# Patient Record
Sex: Female | Born: 1945 | Race: White | Hispanic: No | Marital: Married | Smoking: Never smoker
Health system: Southern US, Academic
[De-identification: ages and names within clinical notes are randomized; demographics above are authoritative.]

## PROBLEM LIST (undated history)

## (undated) DIAGNOSIS — J45909 Unspecified asthma, uncomplicated: Secondary | ICD-10-CM

## (undated) DIAGNOSIS — F411 Generalized anxiety disorder: Secondary | ICD-10-CM

## (undated) DIAGNOSIS — E079 Disorder of thyroid, unspecified: Secondary | ICD-10-CM

## (undated) DIAGNOSIS — D691 Qualitative platelet defects: Secondary | ICD-10-CM

## (undated) DIAGNOSIS — K219 Gastro-esophageal reflux disease without esophagitis: Secondary | ICD-10-CM

## (undated) DIAGNOSIS — I10 Essential (primary) hypertension: Secondary | ICD-10-CM

## (undated) DIAGNOSIS — E782 Mixed hyperlipidemia: Secondary | ICD-10-CM

## (undated) HISTORY — PX: HX NO SURGICAL PROCEDURES: 2100001501

---

## 1976-05-22 ENCOUNTER — Inpatient Hospital Stay (HOSPITAL_COMMUNITY): Payer: Self-pay

## 2009-02-07 IMAGING — MG MAMMO SCREEN W CAD
1 series · 4 of 4 positions shown · non-contrast
Comparison: none

EXAM:
BILATERAL DIGITAL SCREENING MAMMOGRAM WITH CAD
HISTORY: Screening.
TECHNIQUE: Digital screening mammography was performed with CAD.

[Series 2: R CC · right · 4 of 4 slices shown]
[im 1/4]
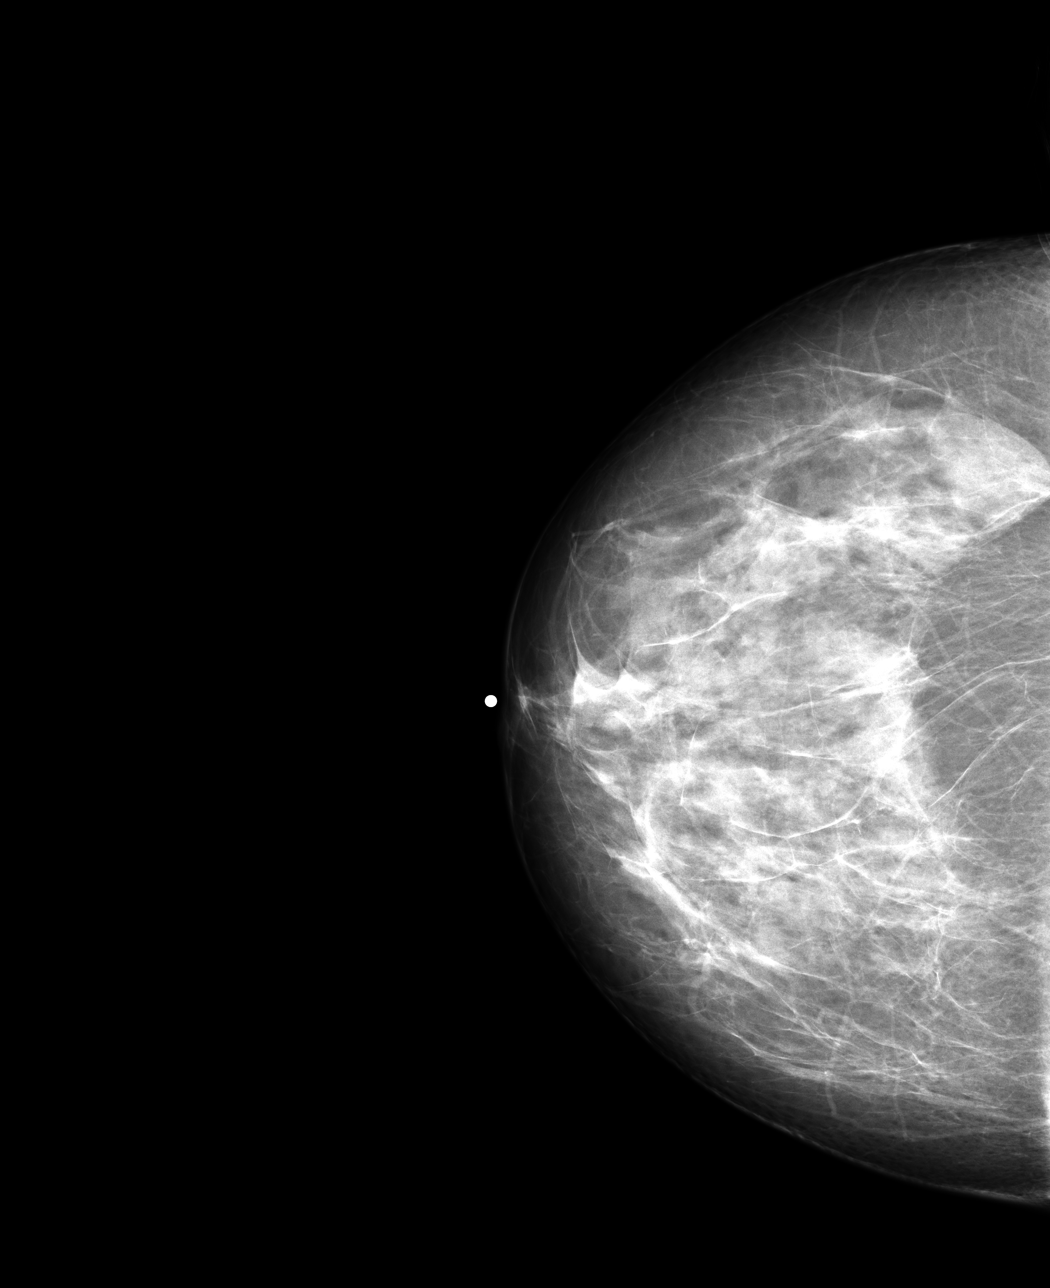
[im 2/4]
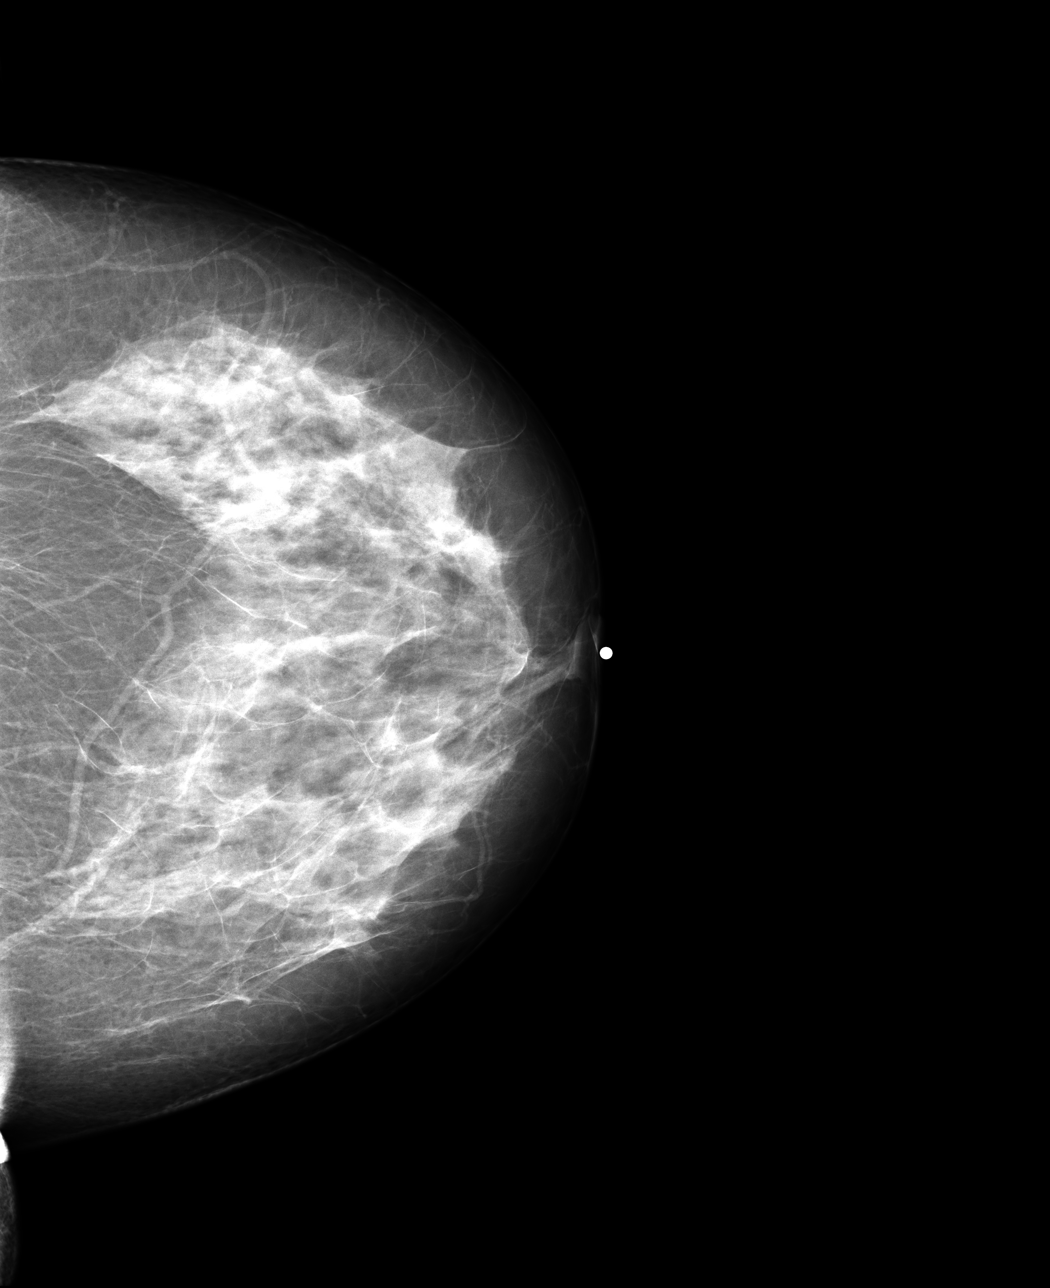
[im 3/4]
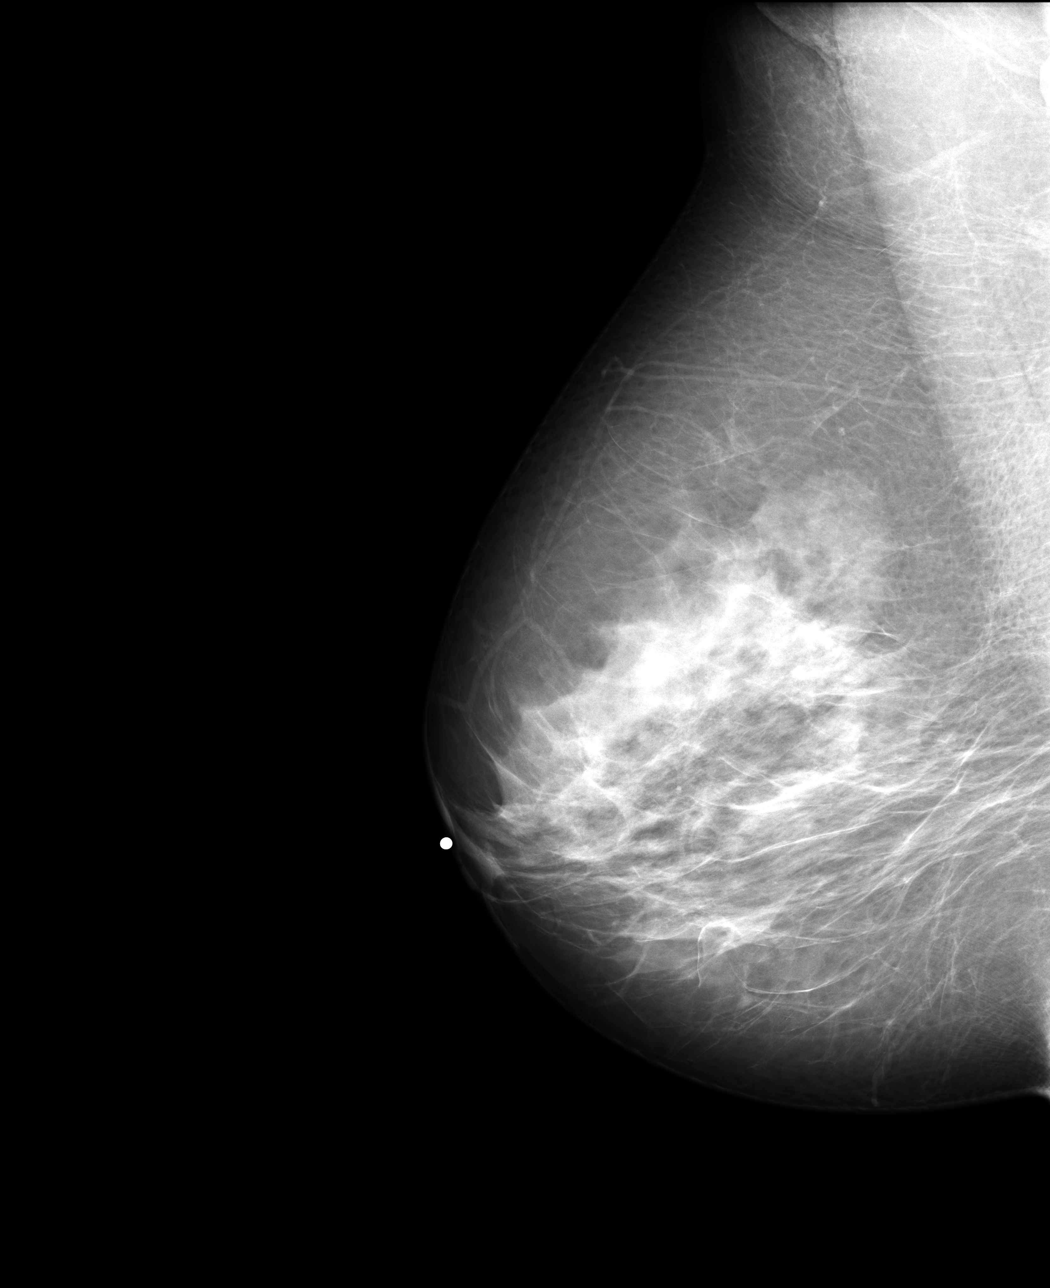
[im 4/4]
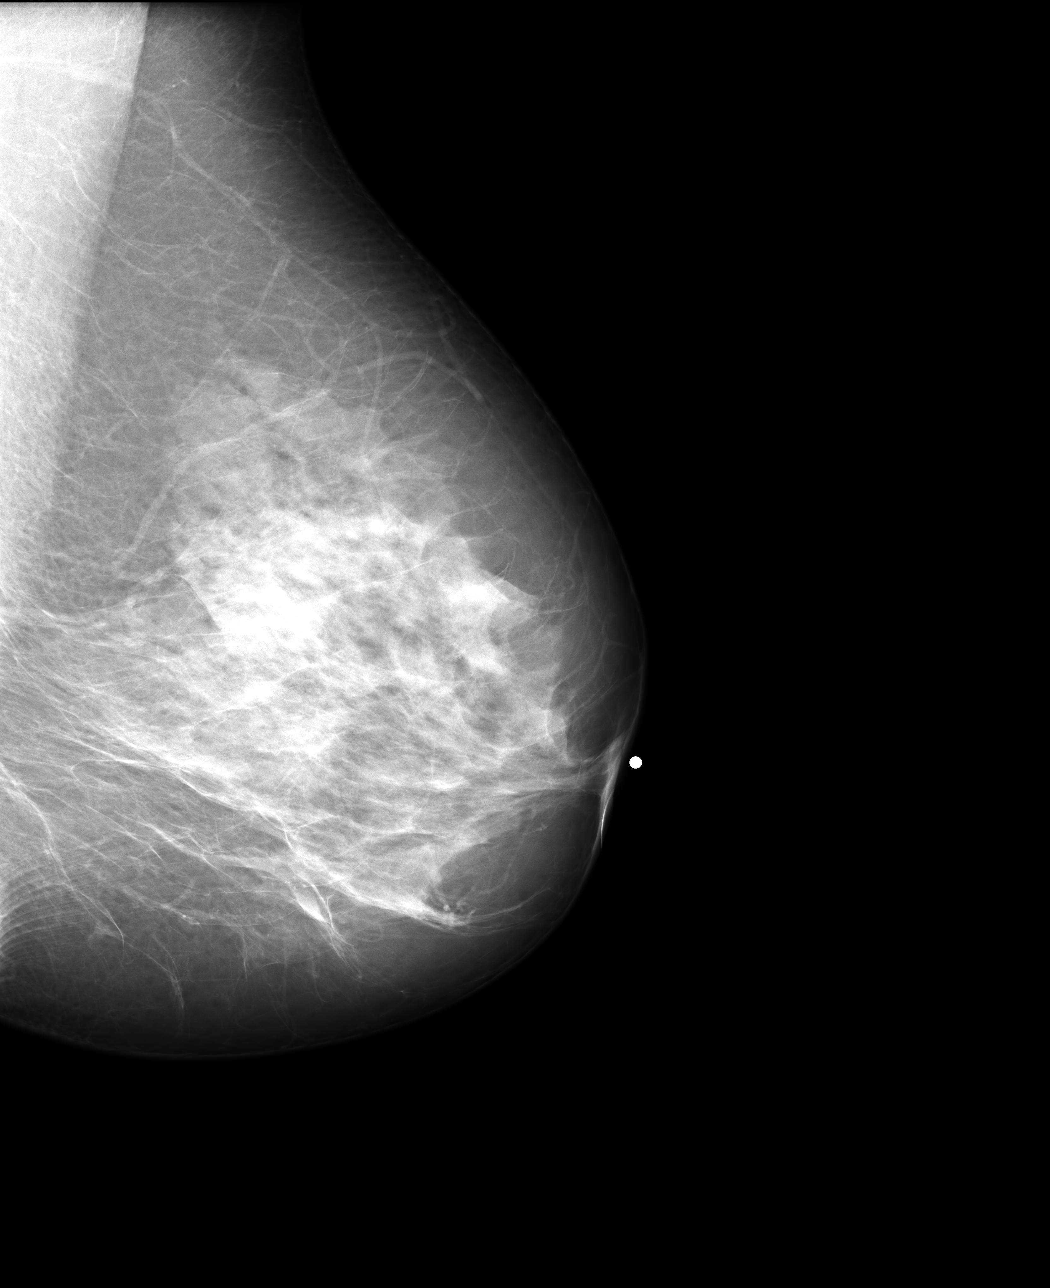

[4 of 4 positions shown; findings below may reference images not displayed]

FINDINGS: There is a moderate amount of parenchyma with a benign appearance in a symmetrical distribution.  No malignant-appearing masses, spiculated lesions, or suspicious microcalcifications are seen.  No significant change is evident compared to prior films dated April 13, 2006.

NOTE:

In compliance with Federal regulations, the results of this mammogram are being sent to the patient.
IMPRESSION: No radiographic evidence of malignancy.

Final Assessment Code:  BI-RADS 1-Negative

BI-RADS 0
Need additional imaging evaluation
BI-RADS 1
Negative mammogram
BI-RADS 2
Benign finding
BI-RADS 3
Probably benign finding - short interval follow-up suggested
BI-RADS 4
Suspicious abnormality: biopsy should be considered
BI-RADS 5
Highly suggestive of malignancy; appropriate action should be taken

________________________________

## 2010-05-29 IMAGING — MG MAMMO SCREEN W CAD
1 series · 5 of 5 positions shown · non-contrast
Comparison: 02/07/09 and 04/13/06.

Exam:
Bilateral digital screening mammogram with CAD
HISTORY: Asymptomatic 65 year old with family history of breast cancer in her mother.

[Series 2: R CC · right · 5 of 5 slices shown]
[im 1/5]
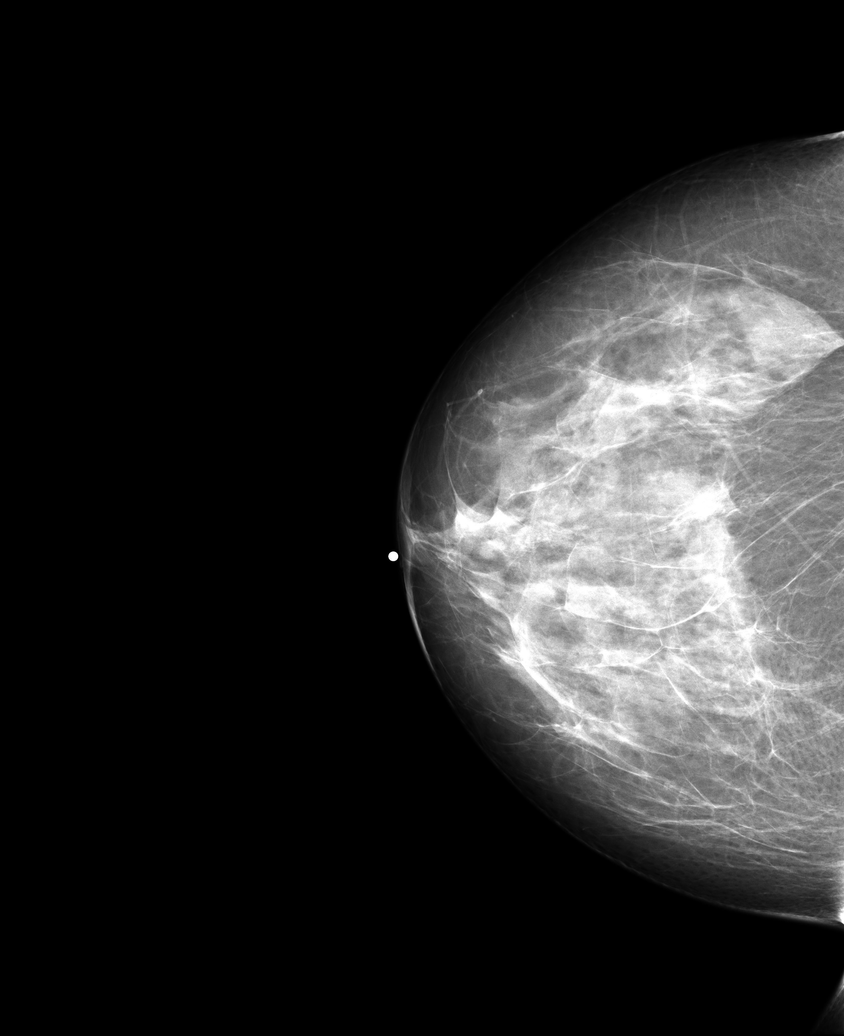
[im 2/5]
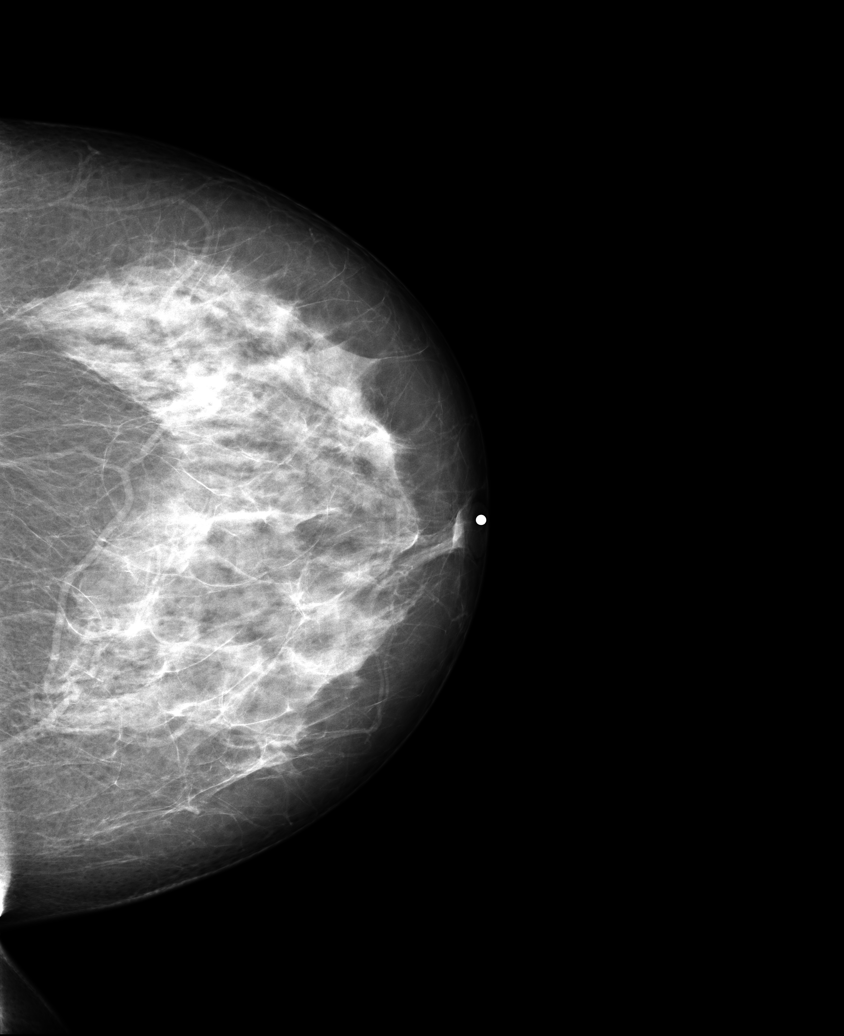
[im 3/5]
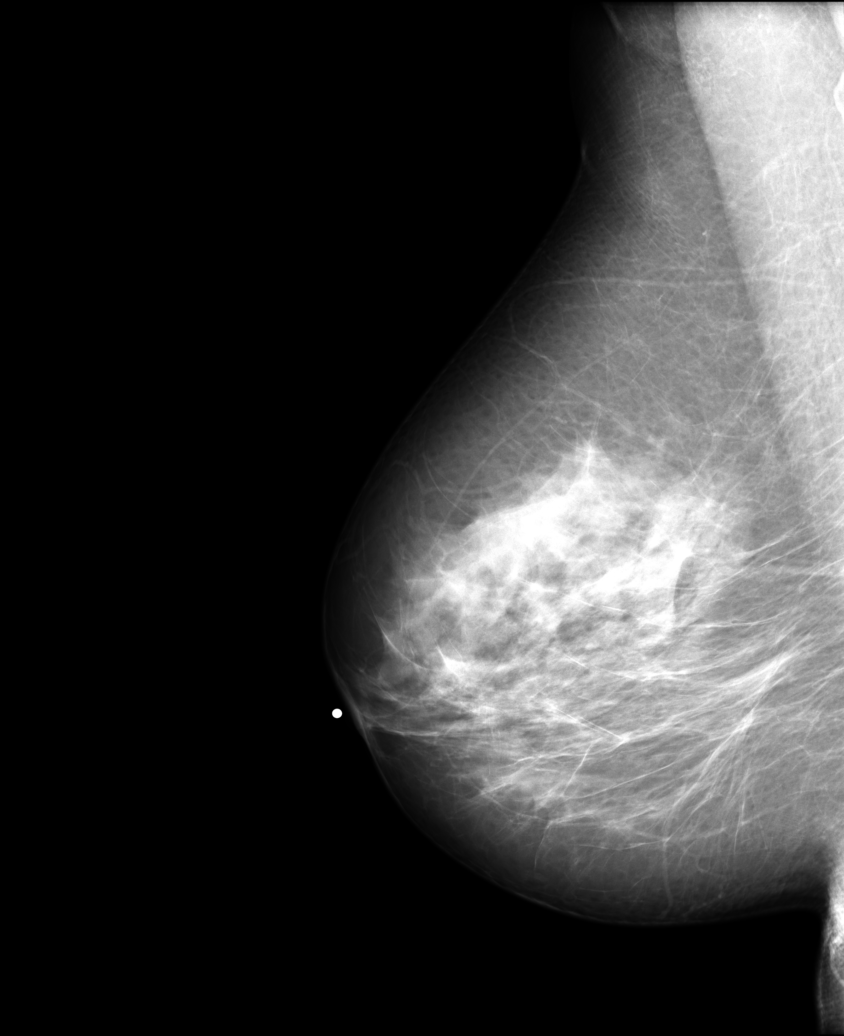
[im 4/5]
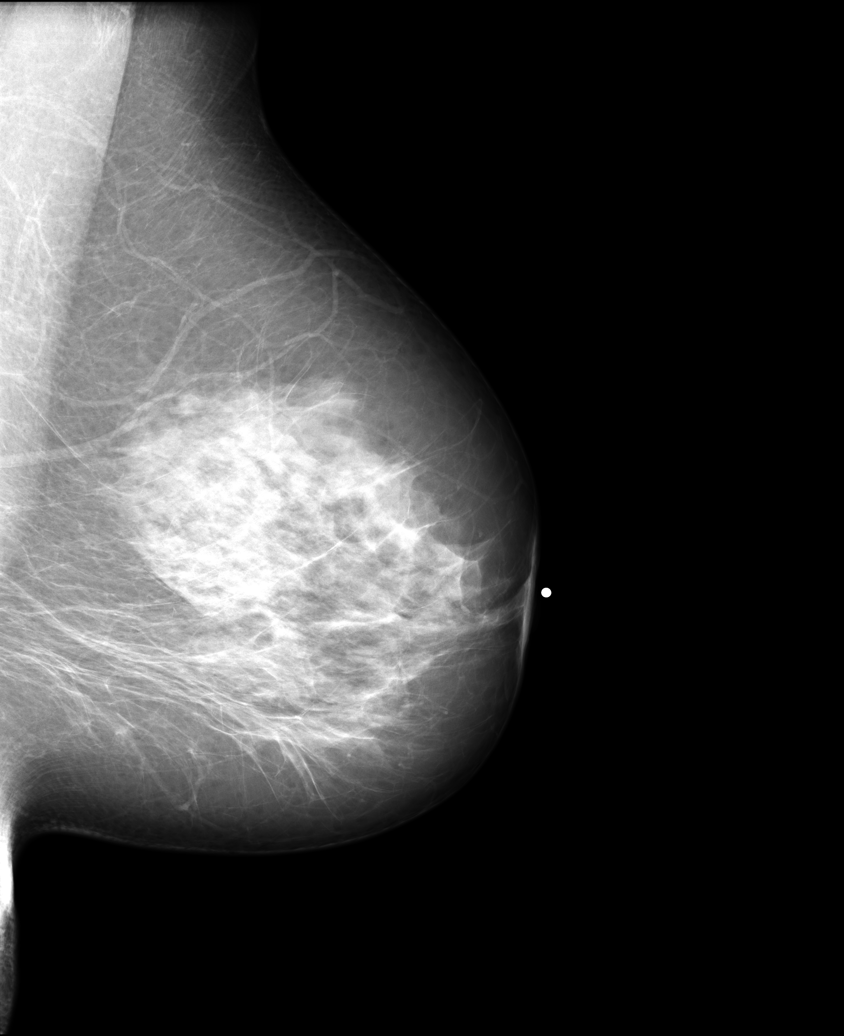
[im 5/5]
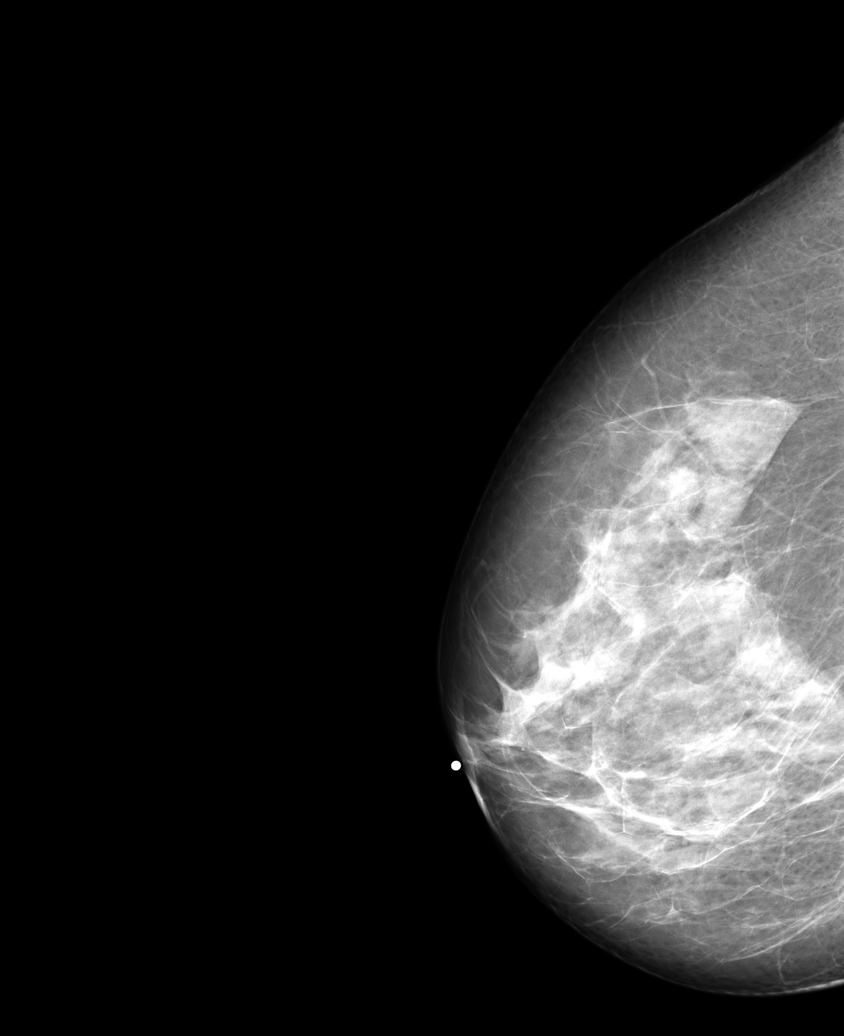

[5 of 5 positions shown; findings below may reference images not displayed]

FINDINGS: Dense breast tissue limits sensitivity to mammogram in this patient. No masses or architectural changes are seen. No evidence of abnormal calcific density, skin changes or nipple changes is seen. 

NOTE:

In compliance with Federal regulations, the results of this mammogram are being sent to the patient.
IMPRESSION: Bilaterally dense breasts limit sensitivity to mammogram. Mammographic findings however are stable. Please correlate with clinical breast examination findings. 
Clinical follow up and mammographic follow up are recommended at twelve months. 
Final Assessment Code:
Bi-Rads 2 

BI-RADS 0
Need additional imaging evaluation
BI-RADS 1
Negative mammogram
BI-RADS 2
Benign finding
BI-RADS 3
Probably benign finding: short-interval follow-up suggested
BI-RADS 4
Suspicious abnormality:  biopsy should be considered
BI-RADS 5
Highly suggestive of malignancy; appropriate action should be taken

________________________________
Jari-Juhani Enlund., signed this document electronically

## 2010-05-29 IMAGING — CR CXR
1 series · 2 of 2 positions shown · non-contrast
Comparison: None available.

Exam:

PA and Lateral Chest 2V
HISTORY: Hypertension, asthma.

[Series 1: view not recorded · 0.17mm/px · 2 of 2 slices shown]
[im 1/2]
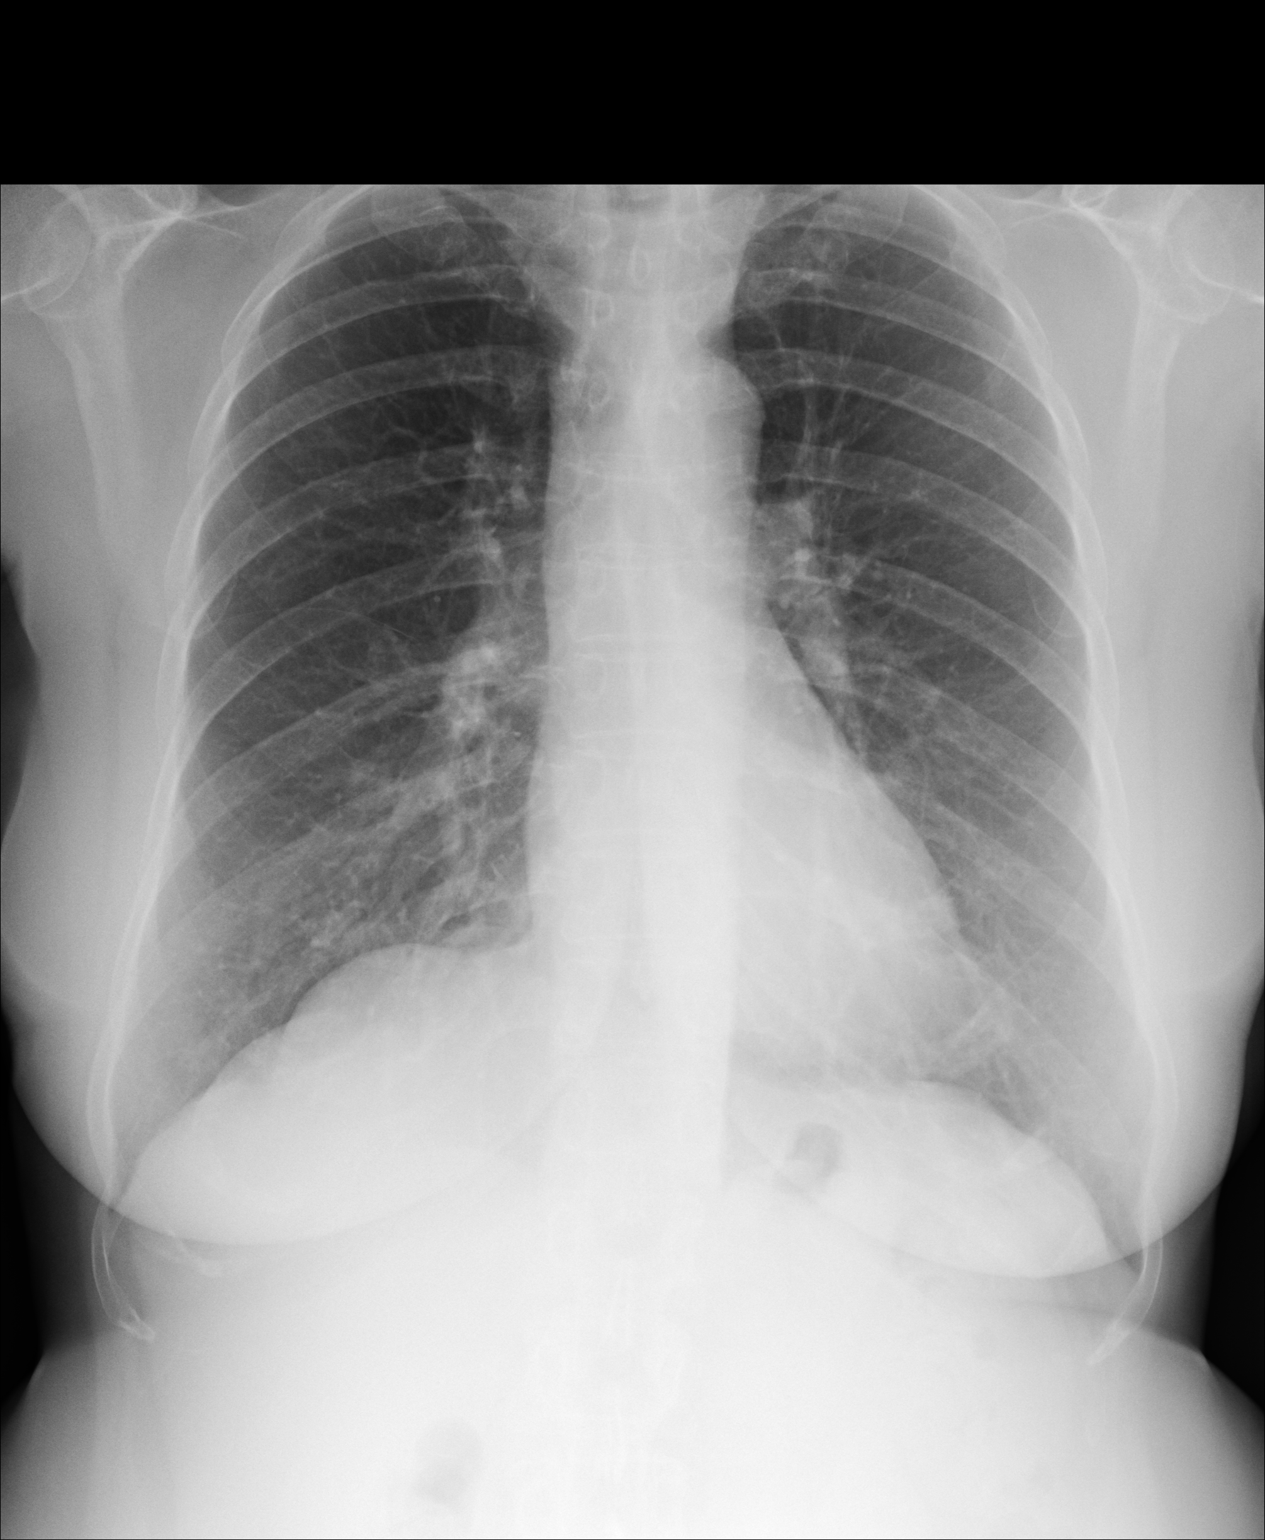
[im 2/2]
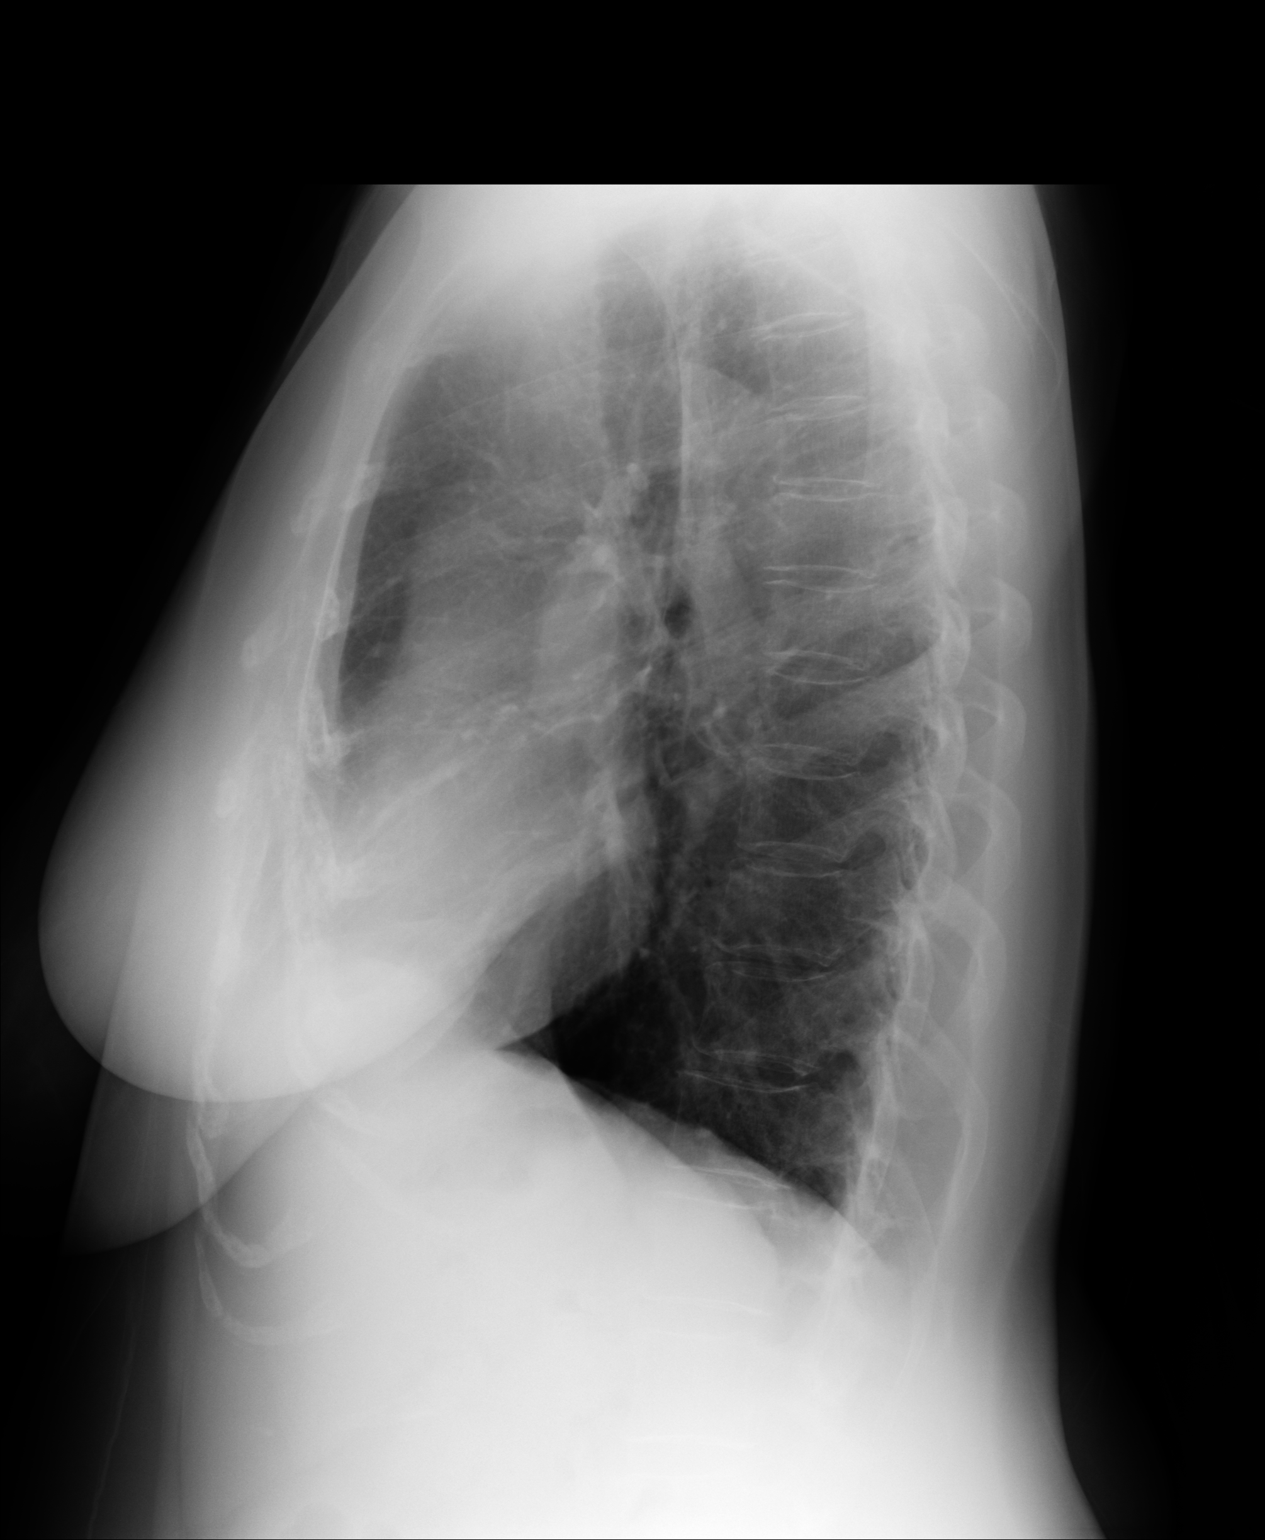

[2 of 2 positions shown; findings below may reference images not displayed]

FINDINGS: Cardiothoracic ratio is normal. Lungs do not show any acute lesions. Mild hyper expansion of the lungs is noted. Hila and mediastinum are normal.
IMPRESSION: No significant cardiopulmonary abnormalities are seen other than mild hyper expansion of the lungs. 

________________________________

[DATE] [DATE]

## 2011-06-01 IMAGING — MG MAMMO SCREEN W CAD
1 series · 4 of 4 positions shown · non-contrast
Comparison: 05/29/10 and 02/07/09.

INDICATION: Screening
ADDITIONAL HISTORY:
Patient denies previous breast surgeries. She denies any current problems with her breasts. 

RISK FACTOR:
Positive family history of breast cancer (mother).
TECHNIQUE: Bilateral MLO and CC digital images were obtained. This study was reviewed using iCAD 200 software.

[Series 2: R CC · right · 4 of 4 slices shown]
[im 1/4]
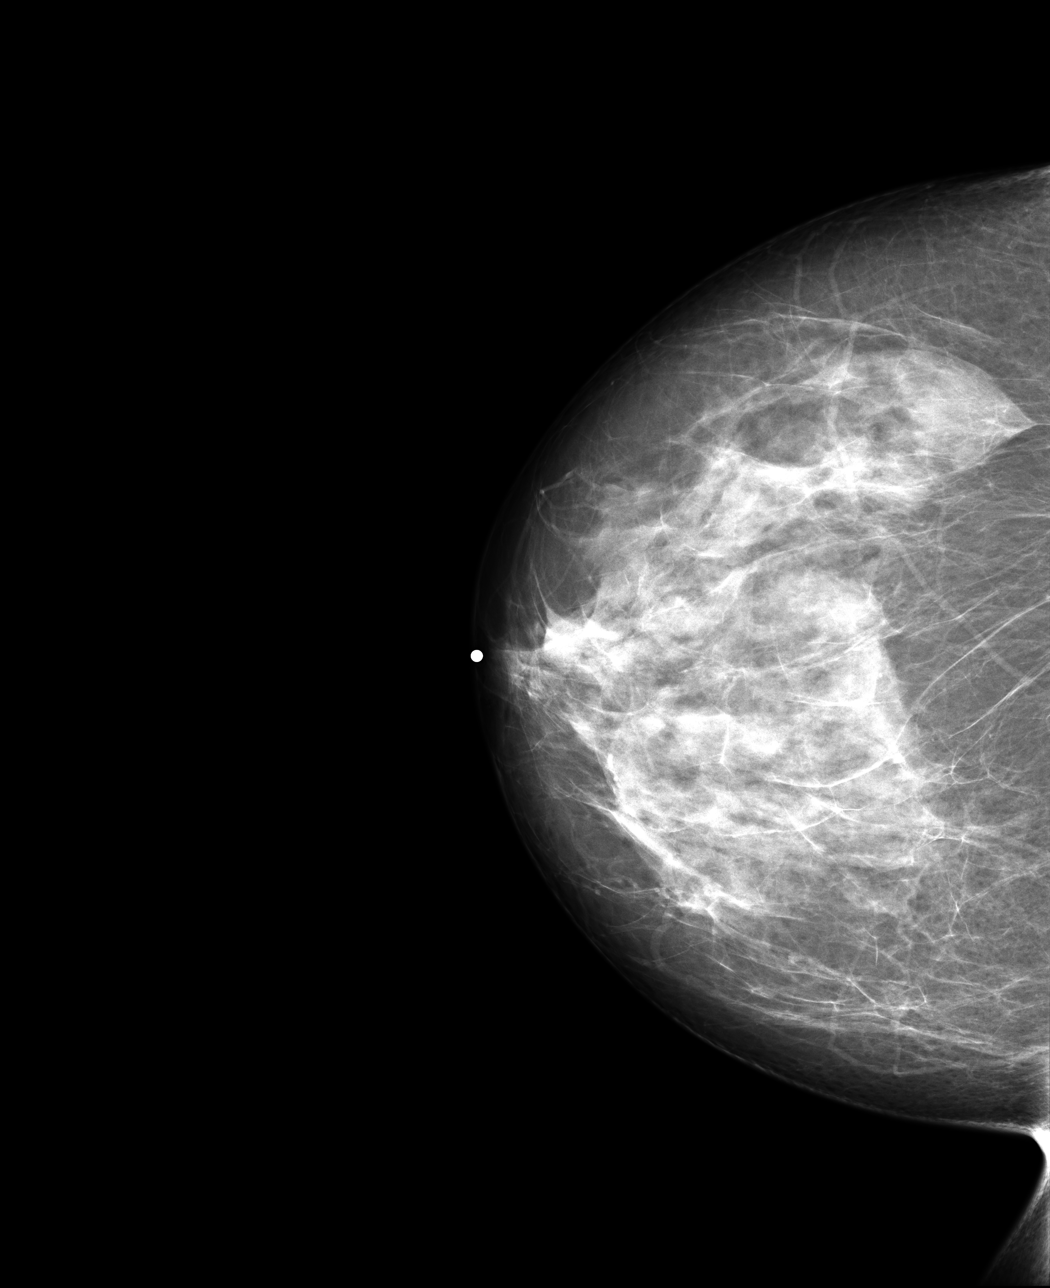
[im 2/4]
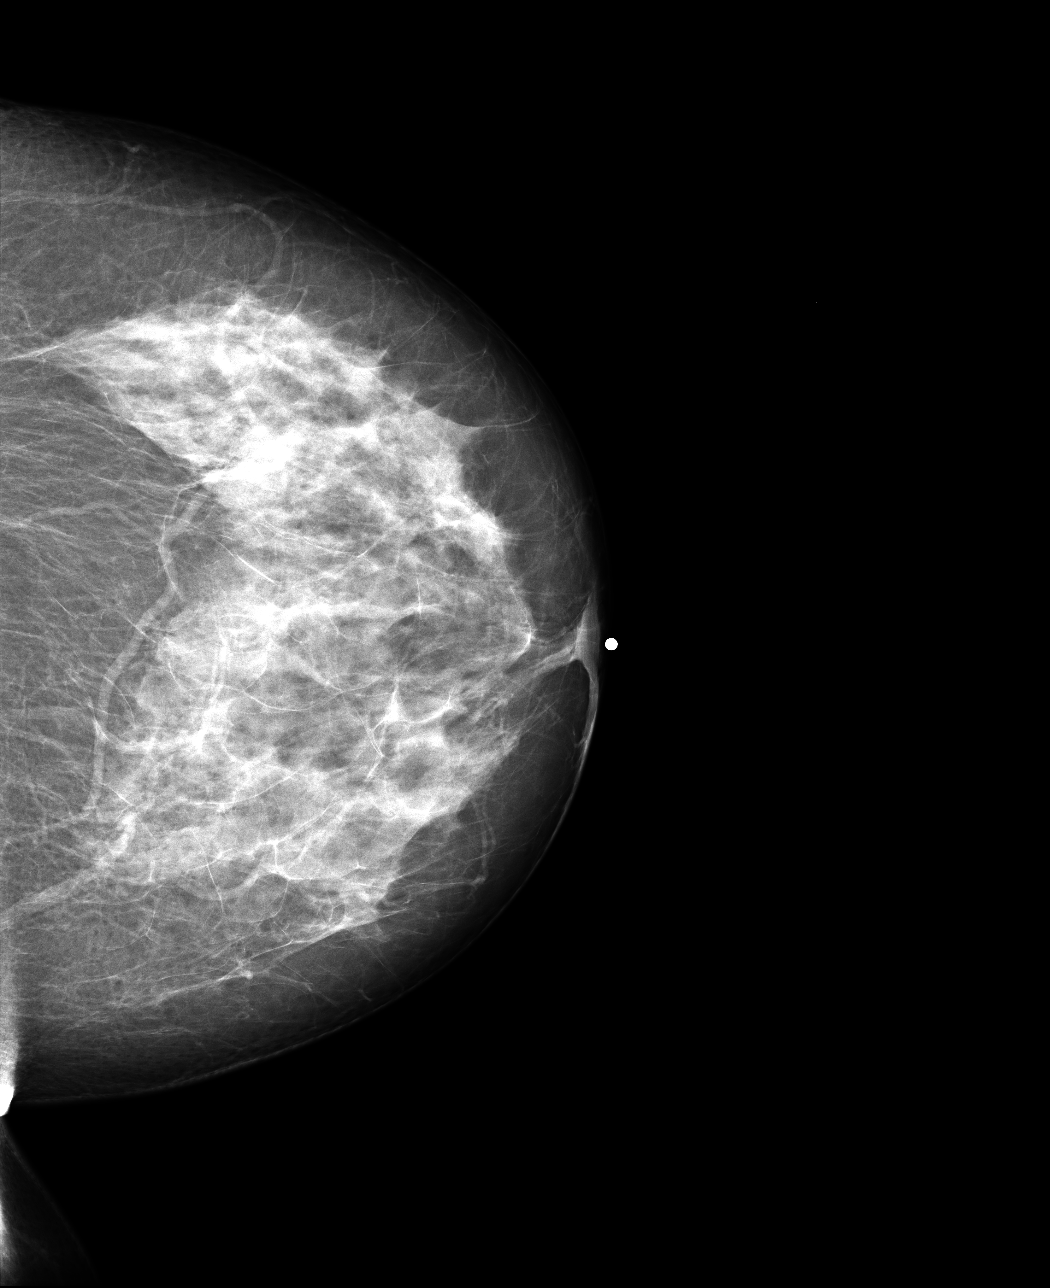
[im 3/4]
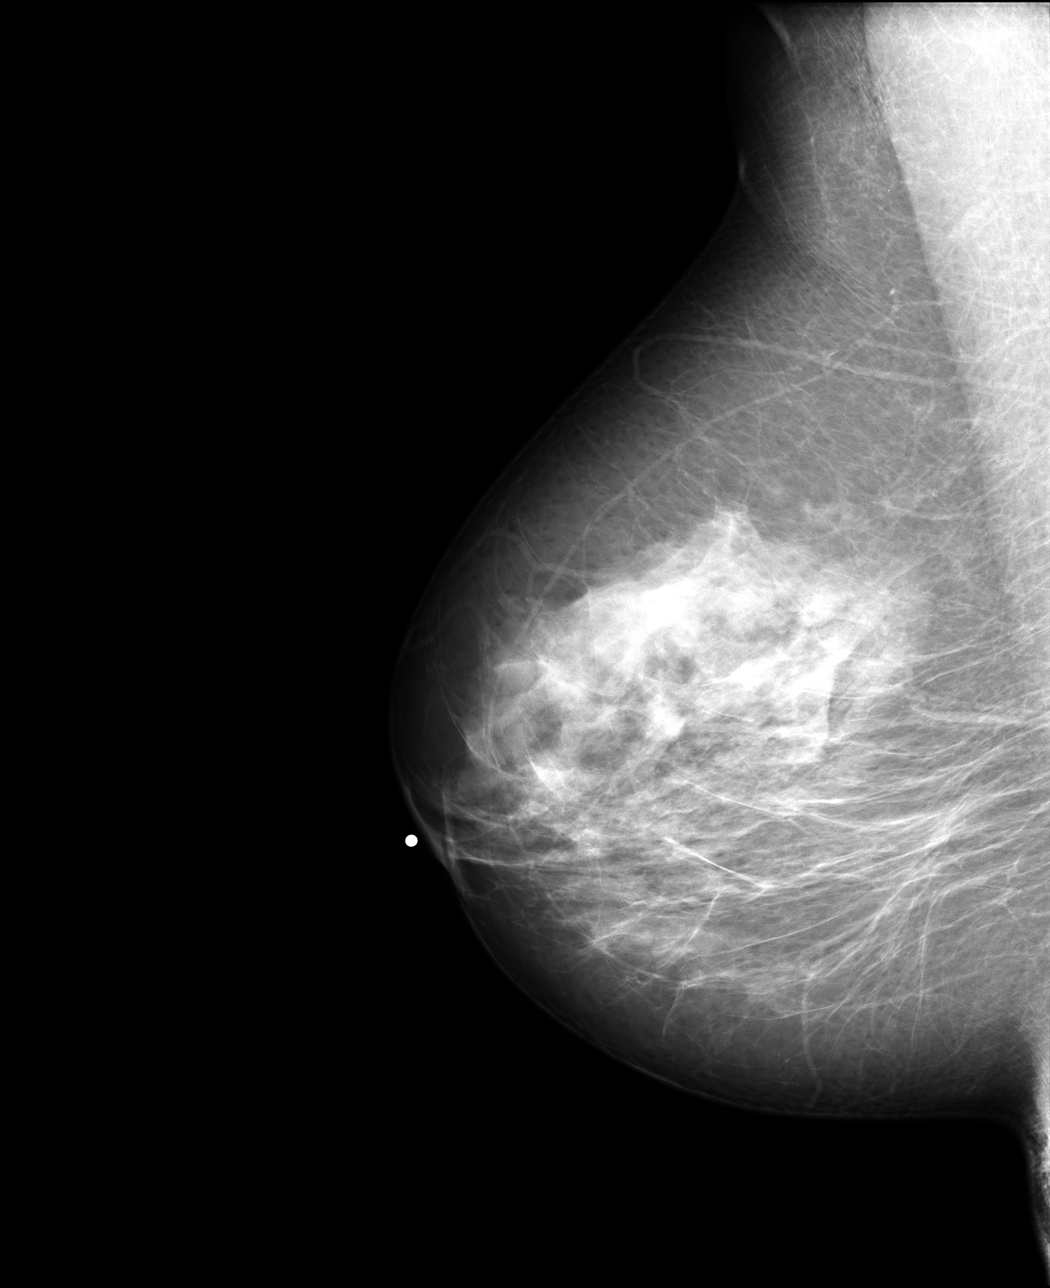
[im 4/4]
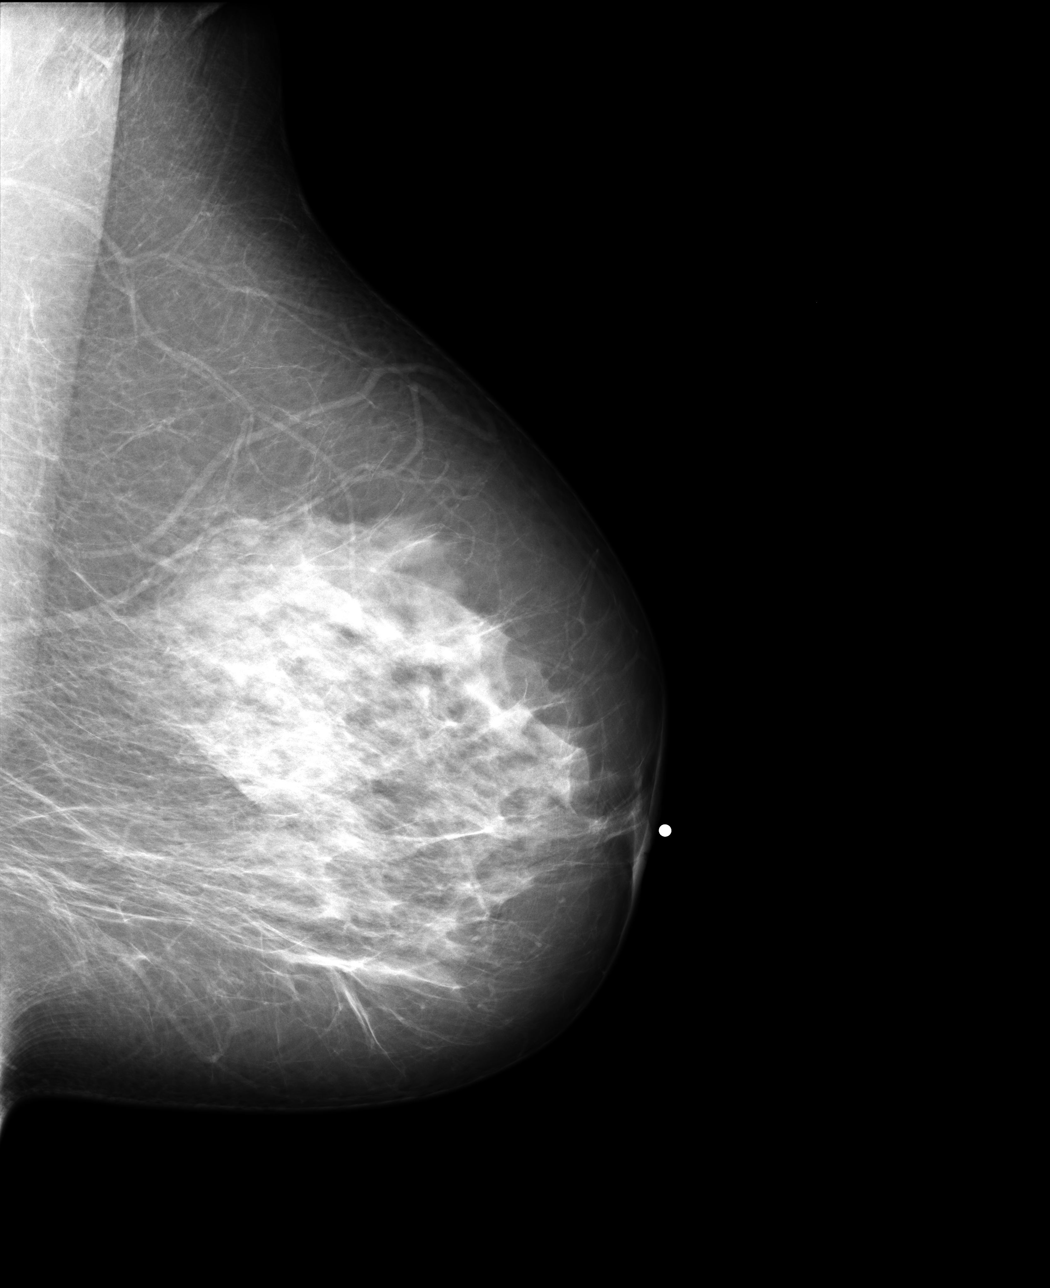

[4 of 4 positions shown; findings below may reference images not displayed]

FINDINGS: The breasts have scattered fibroglandular density. In the right breast on the MLO view, there is a new mass identified. It is not well seen on the MLO view.
IMPRESSION: BI-RADS 0

RECOMMENDATION: Right breast spot compression in the CC and MLO views as well as right breast ultrasound in the superior aspect of the breast. 

FINAL ASSESSMENT CODE:
BI-RADS 0

________________________________

## 2011-06-09 IMAGING — MG MAMMO UNI RT DIAGNOSTIC W CAD
1 series · 2 of 2 positions shown · non-contrast
Comparison: 06/01/11 and 05/29/10.

Dig Mammo CB add views, Right Breast Ultrasound

Exam:
Right breast digital diagnostic mammogram and right breast ultrasound
INDICATION: 66 year old asymptomatic female was called back for further evaluation of a new mass within the superior aspect of the right breast.

[Series 2: R CC · right · 2 of 2 slices shown]
[im 1/2]
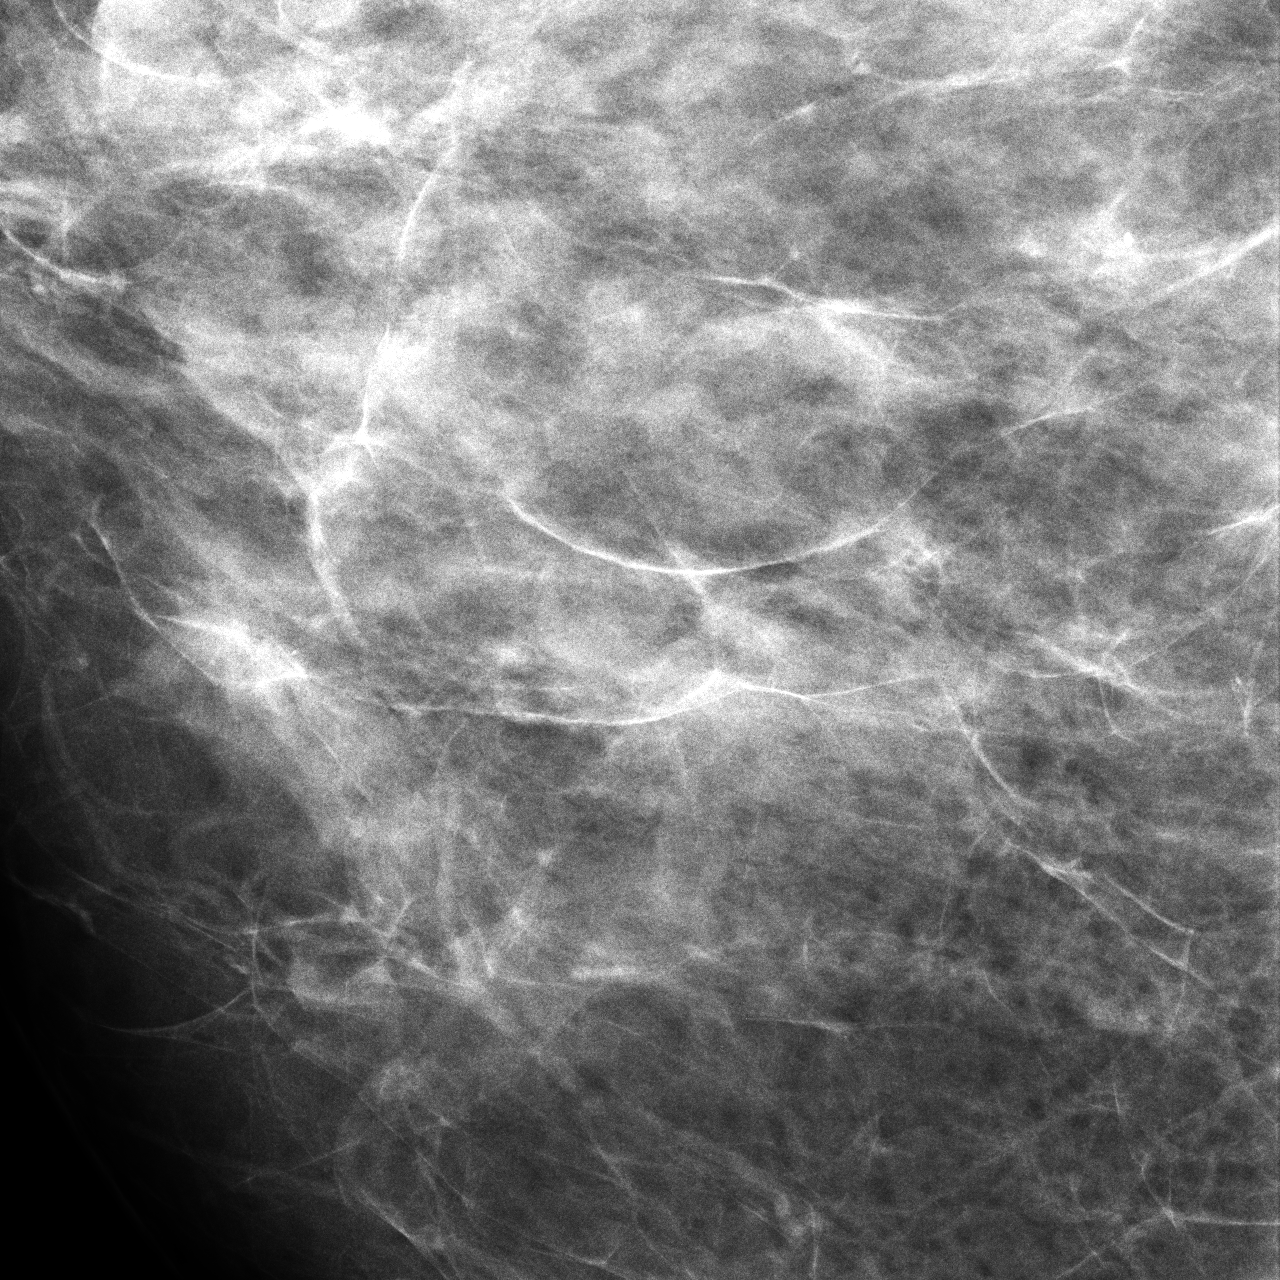
[im 2/2]
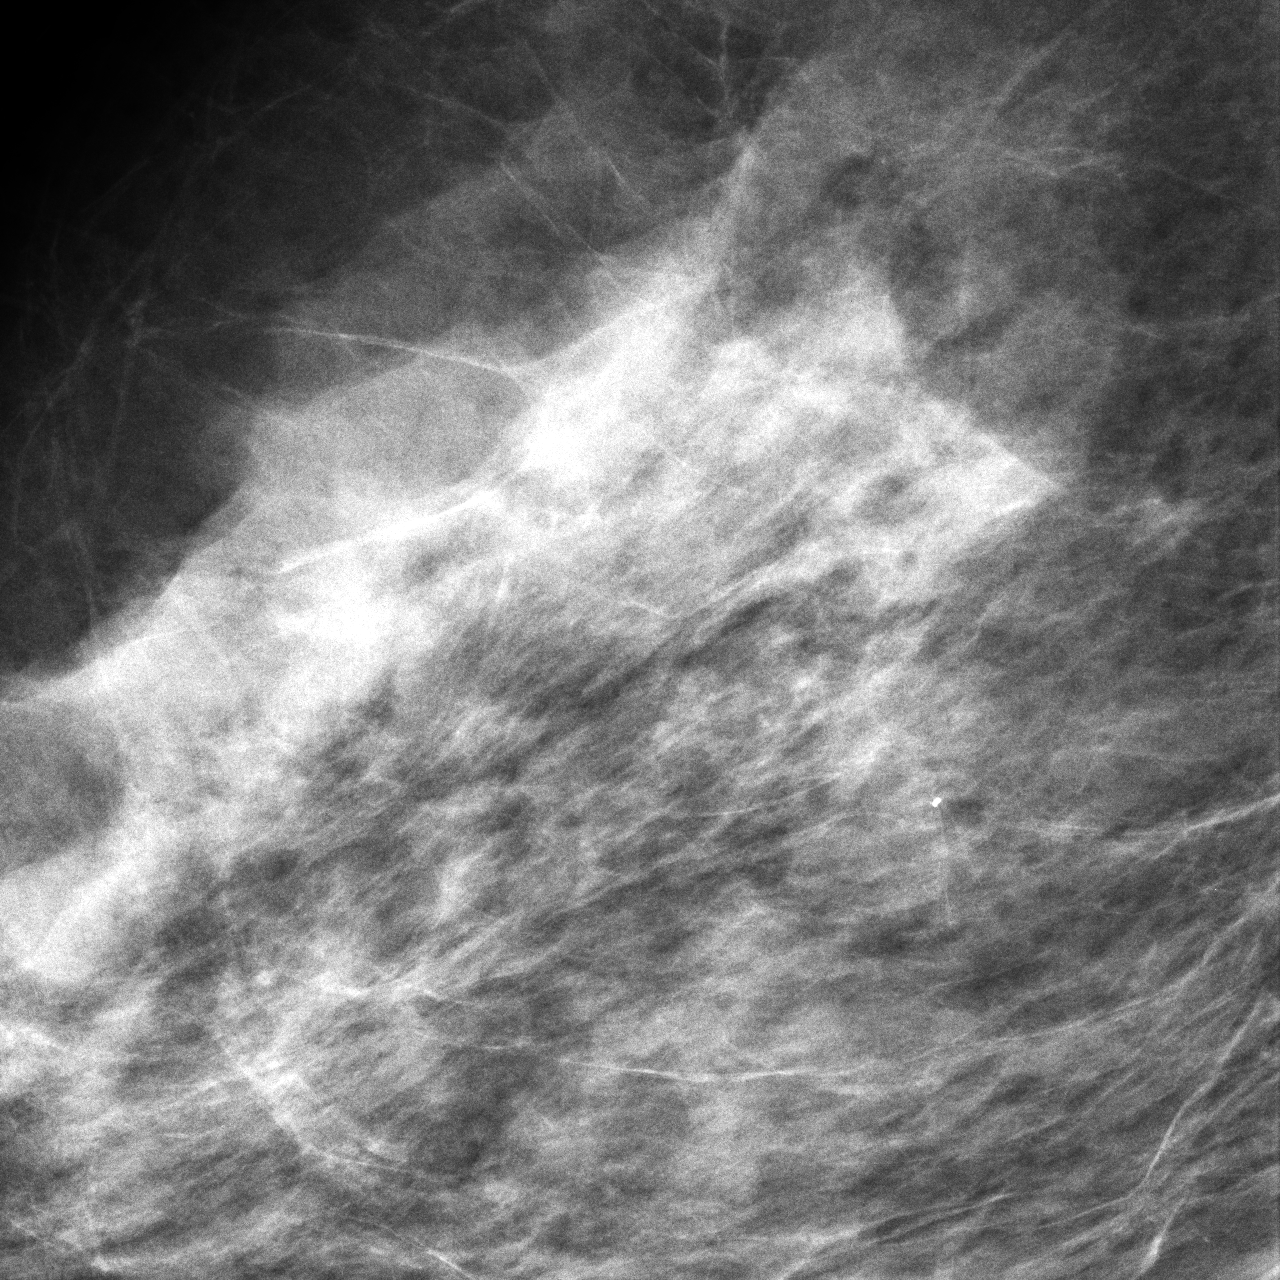

[2 of 2 positions shown; findings below may reference images not displayed]

FINDINGS: Spot compression view of the superior right breast was performed in CC projection. Previously noted mass is somewhat less conspicuous on this exam. 

Further evaluation of the superior right breast was also performed with ultrasound which demonstrates a 7 x 3.5 mm benign appearing hypoechoic nodule at 12 o’clock position. There is no suspicious solid or cystic mass. 

NOTE:

In compliance with Federal regulations, the results of this mammogram are being sent to the patient.
IMPRESSION: Bi-Rads 3-Probable benign findings. 
RECOMMENDATIONS: 7 x 3.5 mm hypoechoic benign appearing mass at 12 o’clock position within the right breast. Further evaluation with spot compression views and ultrasound is recommended in 6 months to reassess this finding. 
Final Assessment Code:
Bi-Rads 3
BI-RADS 0
Need additional imaging evaluation
BI-RADS 1
Negative mammogram
BI-RADS 2
Benign finding
BI-RADS 3
Probably benign finding: short-interval follow-up suggested
BI-RADS 4
Suspicious abnormality:  biopsy should be considered
BI-RADS 5
Highly suggestive of malignancy; appropriate action should be taken
________________________________

## 2013-06-02 IMAGING — MG MAMMO DIGITAL SCREENING
1 series · 4 of 4 positions shown · non-contrast
Comparison: 

------------- REPORT [id number] -------------
[facility]
[address]
Mor Ms.[place]I, [place]:
We wish to report the following on your recent mammography examination. We are sending a report to your referring physician or other health care provider. 
(       Normal/Negative:
No evidence of cancer.
This statement is mandated by the [place], Department of Health.
Your examination was performed by one of our technologists, who are registered radiological technologists and also specially certified in mammography:
___
Donjuan, Wavy (M)
Tena, Danielf (M)
Gia, Nelith (M)

Your mammogram was interpreted by our radiologist.
( 
Mmamontsho Seakolo, M.D.
(Annual Breast Examination by a physician or other health care provider
(Annual Mammography Screening beginning at age 40
(Monthly Breast Self Examination
------------- REPORT [id number] -------------
RUGAMA, SAHANA
MAMMO DIGITAL SCREENING WITH CAD
Exam:  
Bilateral digital screening mammogram with CAD
INDICATION: Annual.

[R CC · oblique · right · 4 of 4 slices shown]
[im 1/4]
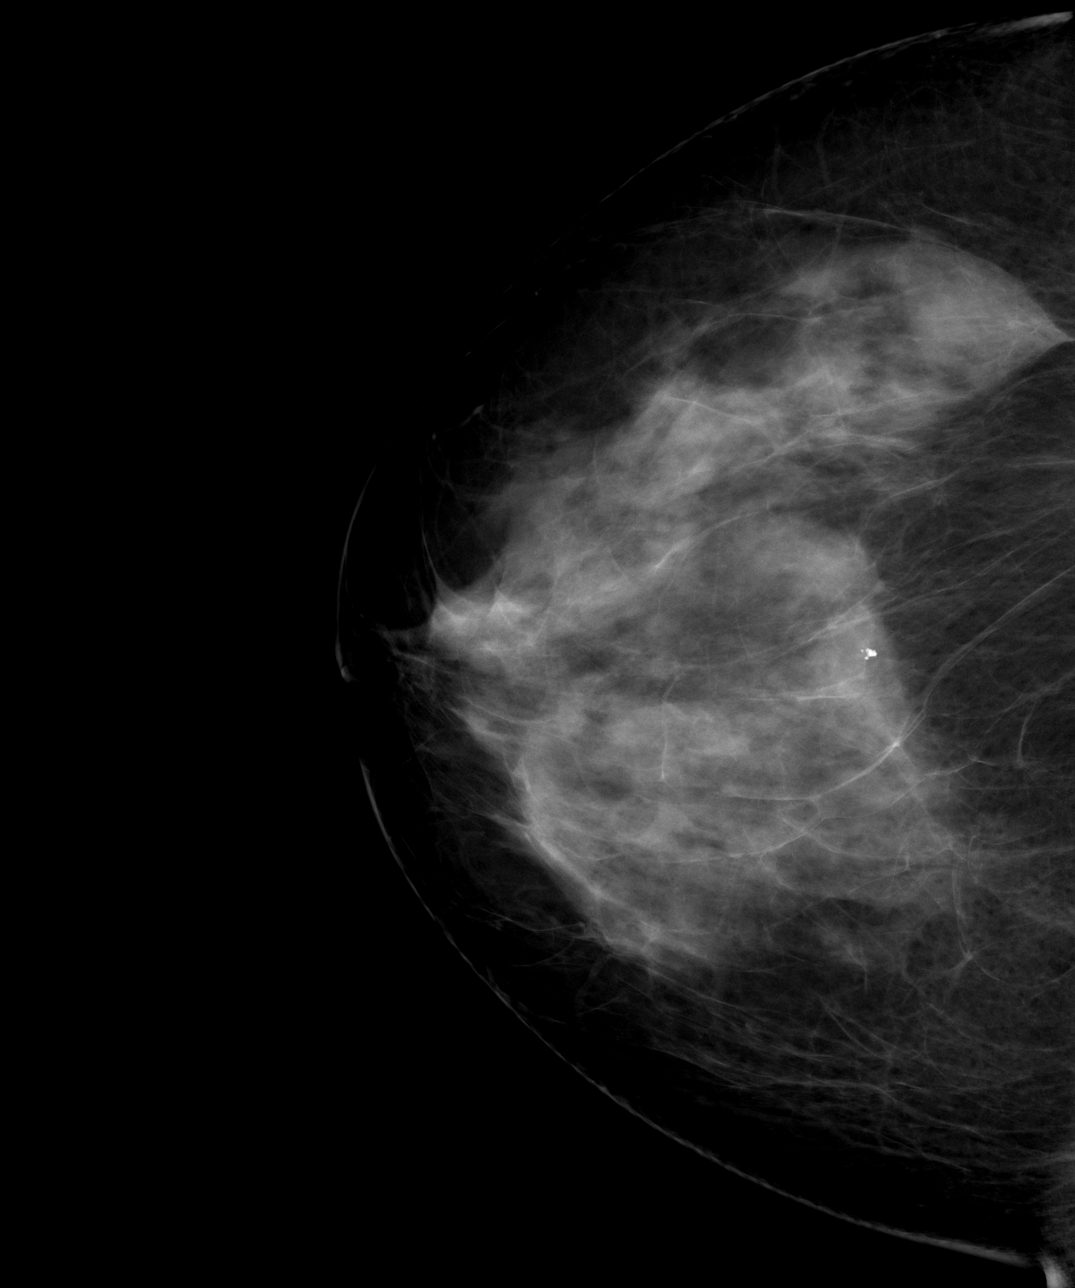
[im 2/4]
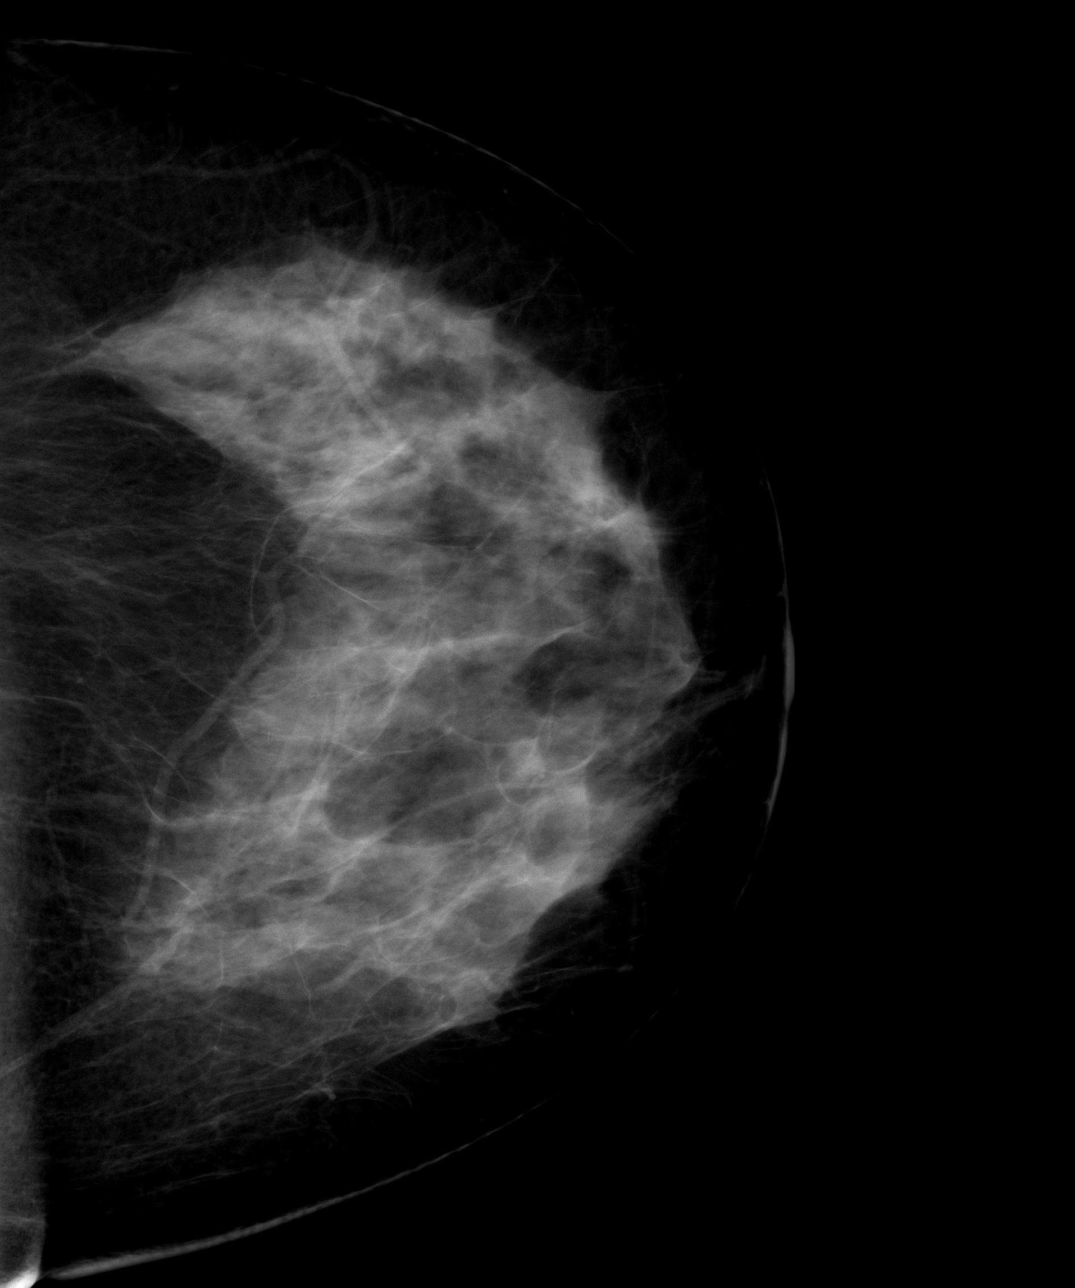
[im 3/4]
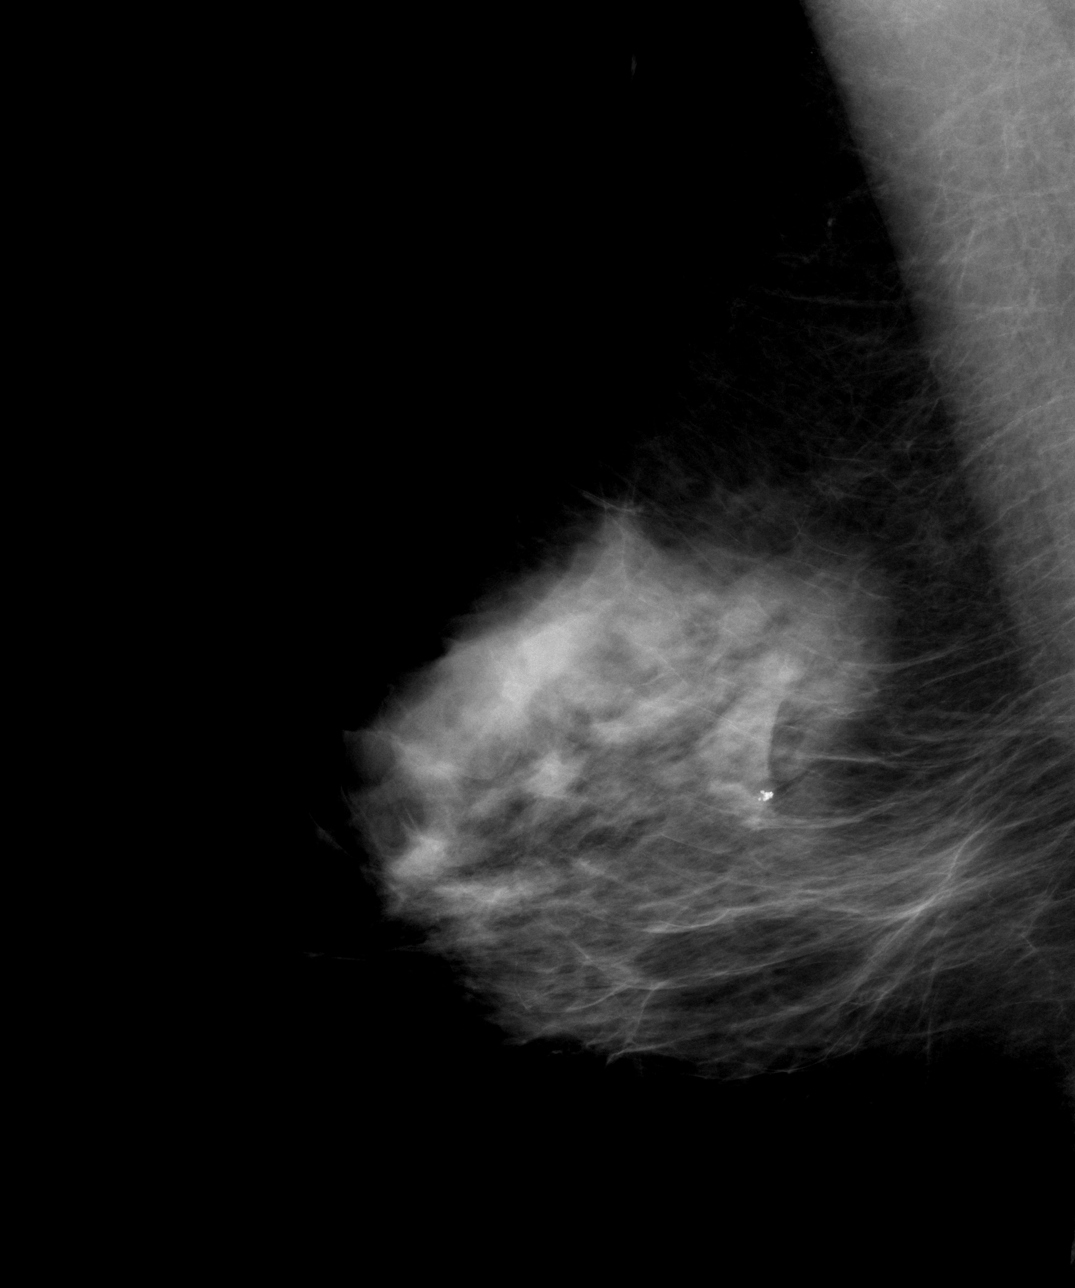
[im 4/4]
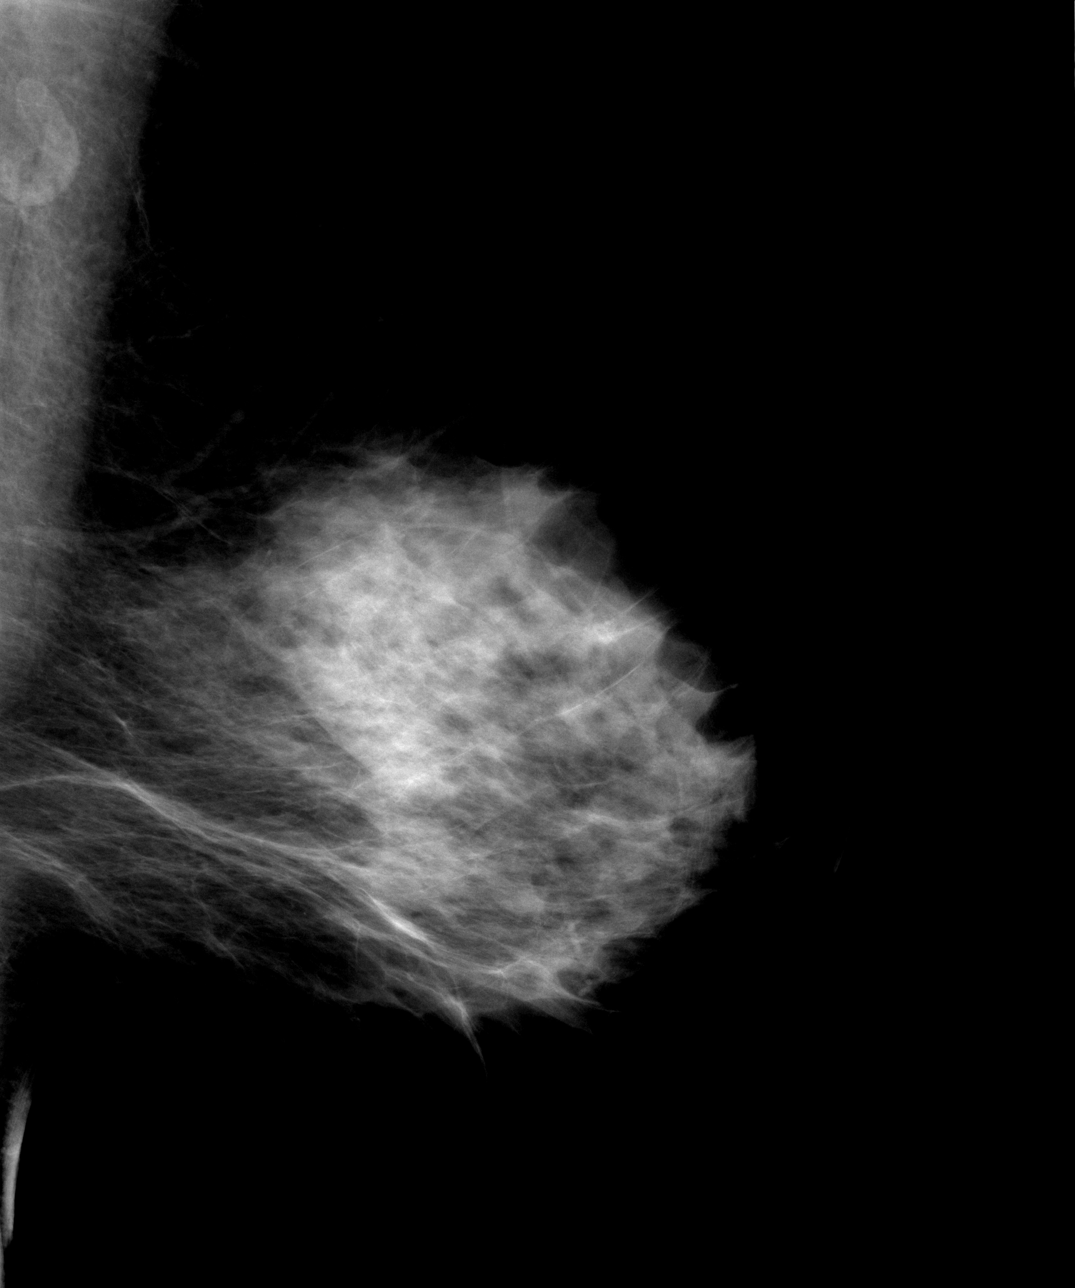

[4 of 4 positions shown; findings below may reference images not displayed]

FINDINGS: Breast parenchyma is heterogeneously dense; this decreases the sensitivity of mammogram. There is no mass or suspicious cluster of microcalcifications. There is no architectural distortion, skin thickening or nipple retraction.
IMPRESSION: 1.
Bi-Rads 2-Benign findings. 
RECOMMENDATIONS: Annual screening mammogram as per ACR guidelines is recommended. 
Final Assessment Code:
Bi-Rads 2 

BI-RADS 0
Need additional imaging evaluation
BI-RADS 1
Negative mammogram
BI-RADS 2
Benign finding
BI-RADS 3
Probably benign finding: short-interval follow-up suggested
BI-RADS 4
Suspicious abnormality:  biopsy should be considered
BI-RADS 5
Highly suggestive of malignancy; appropriate action should be taken
BI-RADS 6
Known Biopsy-proven Malignancy  Appropriate action should be taken
NOTE:
In compliance with Federal regulations, the results of this mammogram are being sent to the patient.

## 2014-03-09 IMAGING — US US PELVIC COMPLETE
1 series · 14 of 25 positions shown · non-contrast
Comparison: None.

Exam:   
Ultrasound pelvis complete
INDICATION: Dysuria.

[Series 1: us pelvic complete · oblique · 0.37mm/px · 14 of 52 slices shown]
[im 1/52]
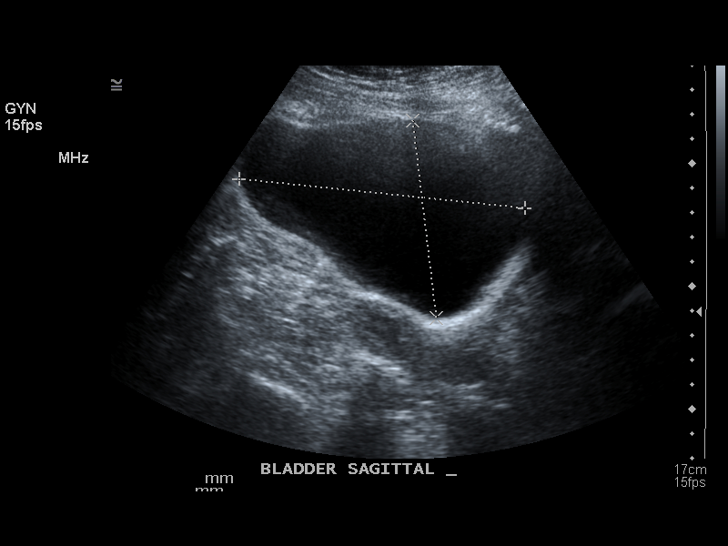
[im 5/52]
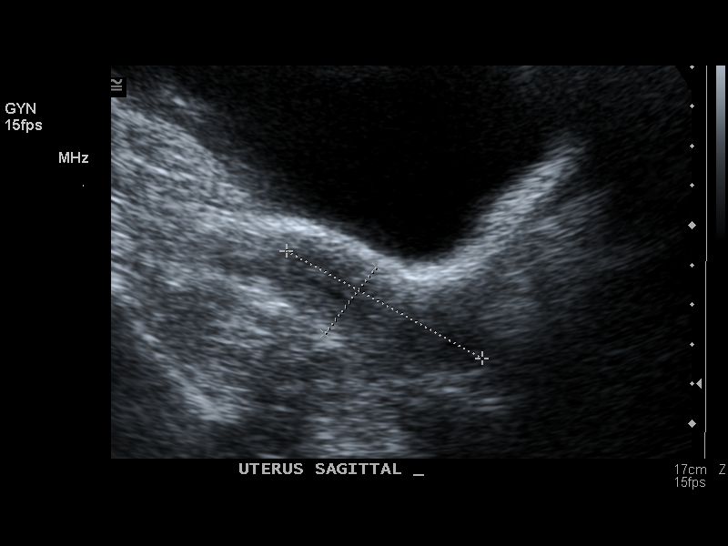
[im 9/52]
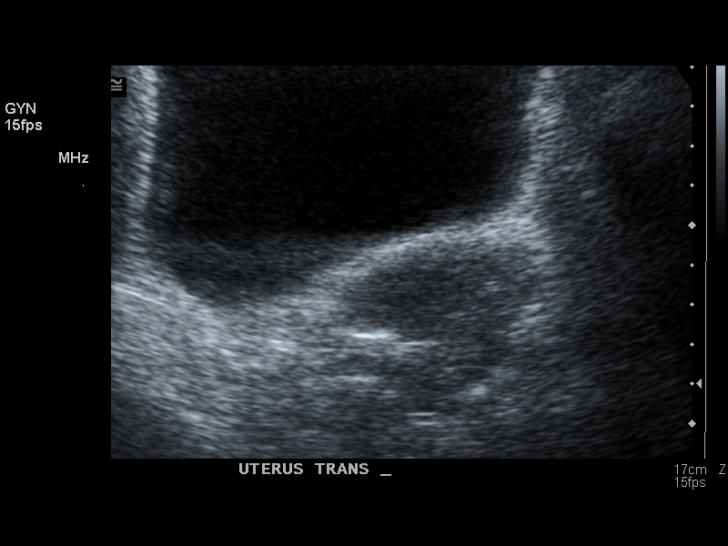
[im 13/52]
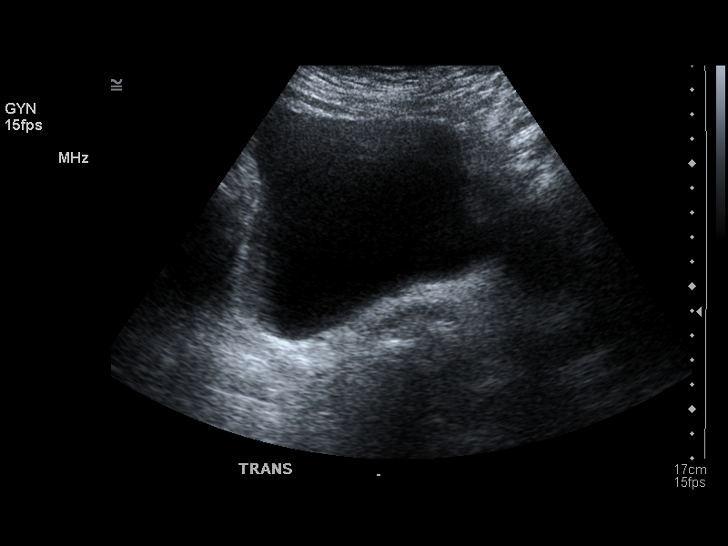
[im 18/52]
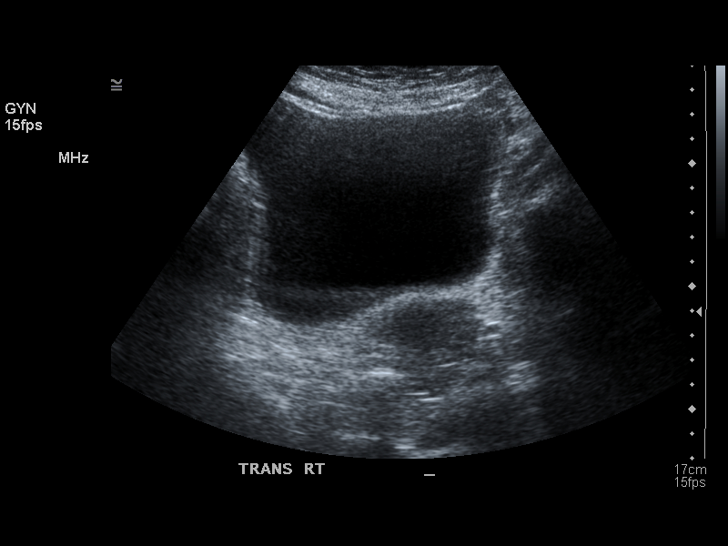
[im 20/52]
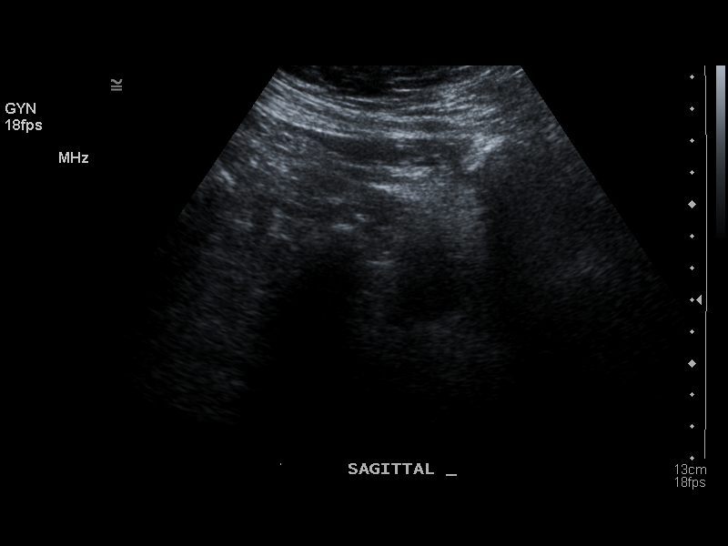
[im 24/52]
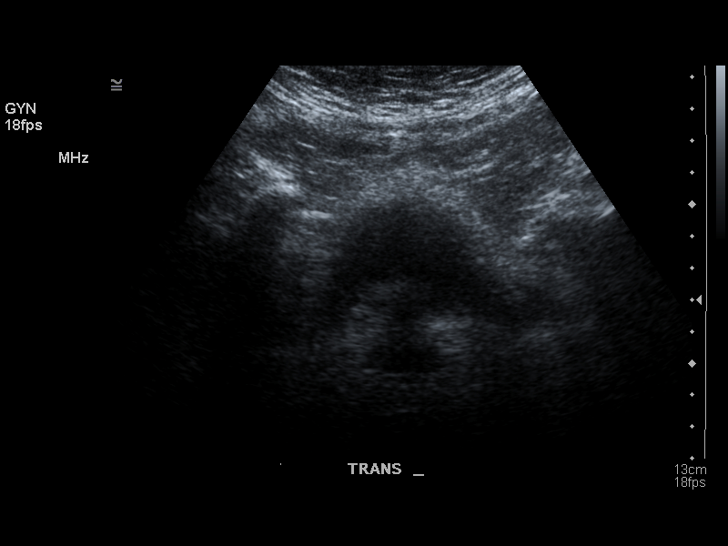
[im 28/52]
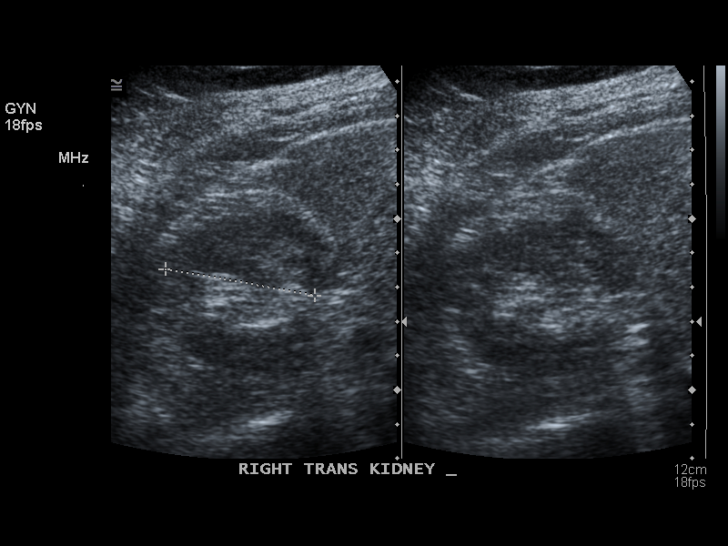
[im 32/52]
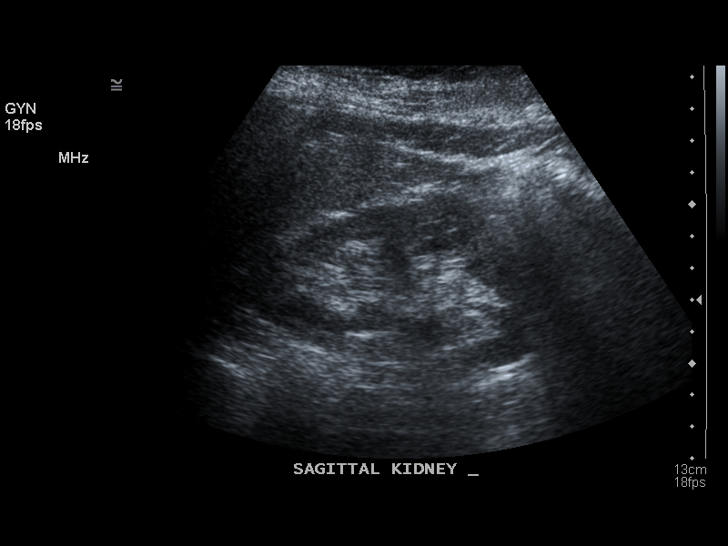
[im 35/52]
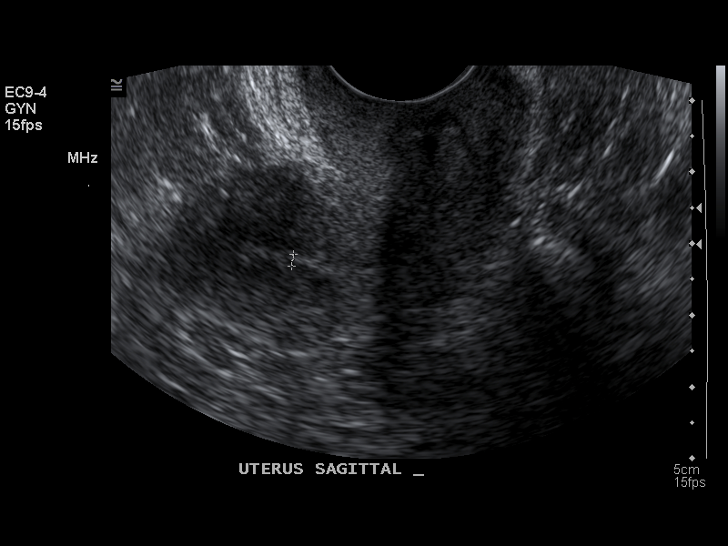
[im 39/52]
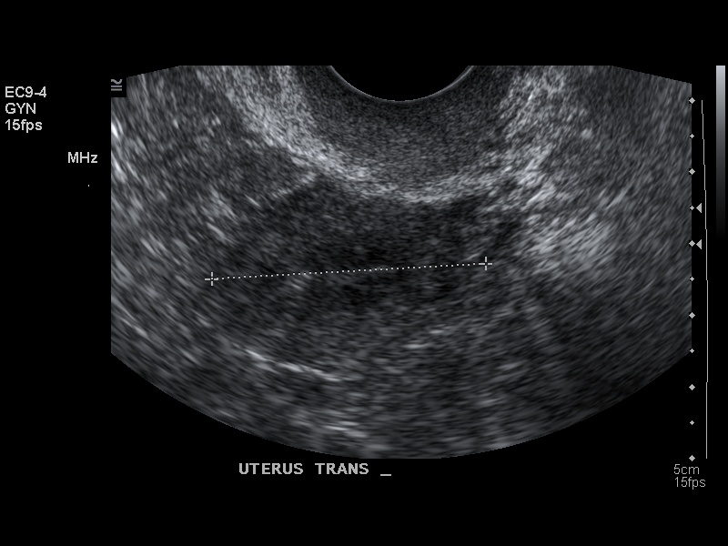
[im 43/52]
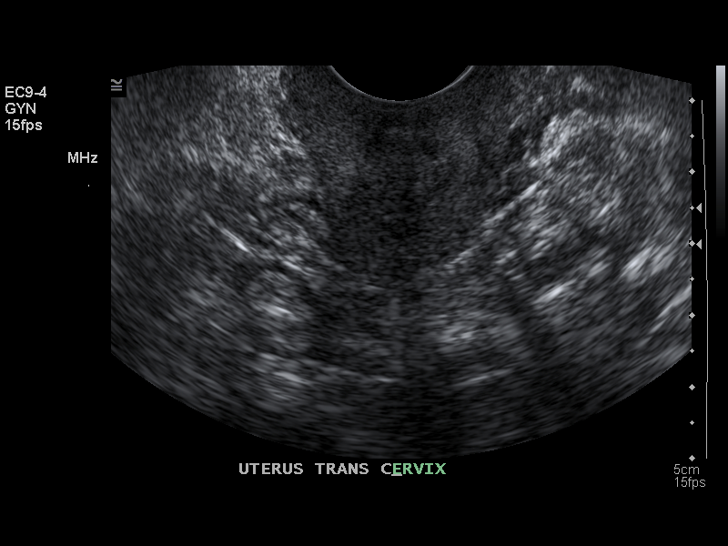
[im 47/52]
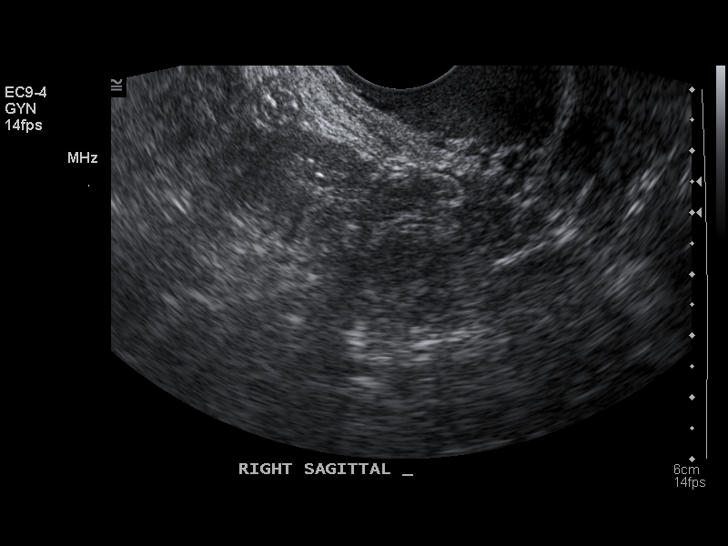
[im 52/52]
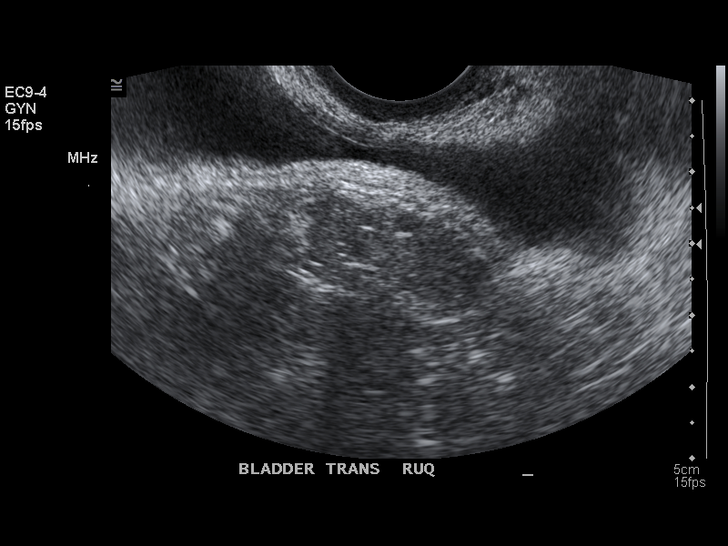

[14 of 25 positions shown; findings below may reference images not displayed]

FINDINGS: Sonographic evaluation of the pelvis was performed transabdominally through a partially distended urinary bladder and transvaginally. 
Uterus is normal in echogenicity and measures 5.7 x 2.3 x 3.8 cm. Endometrial stripe measures 2 mm and is normal. 
Ovaries are not visualized. There is no pelvic free fluid or adnexal mass. 
Urinary bladder is partially distended and grossly unremarkable. There is a minimal post-void residual.
IMPRESSION: 1.
Ovaries not visualized, otherwise unremarkable exam.

## 2014-06-06 IMAGING — MG MAMMO DIGITAL SCREENING
1 series · 4 of 4 positions shown · non-contrast
Comparison: 

------------- REPORT [id number] -------------
ALLSTARR, ADEPRASSTYO

MAMMO DIGITAL SCREENING WITH CAD
Exam:  
Screening digital mammogram with CAD
INDICATION: Annual screening.

[R CC · oblique · right · 4 of 4 slices shown]
[im 1/4]
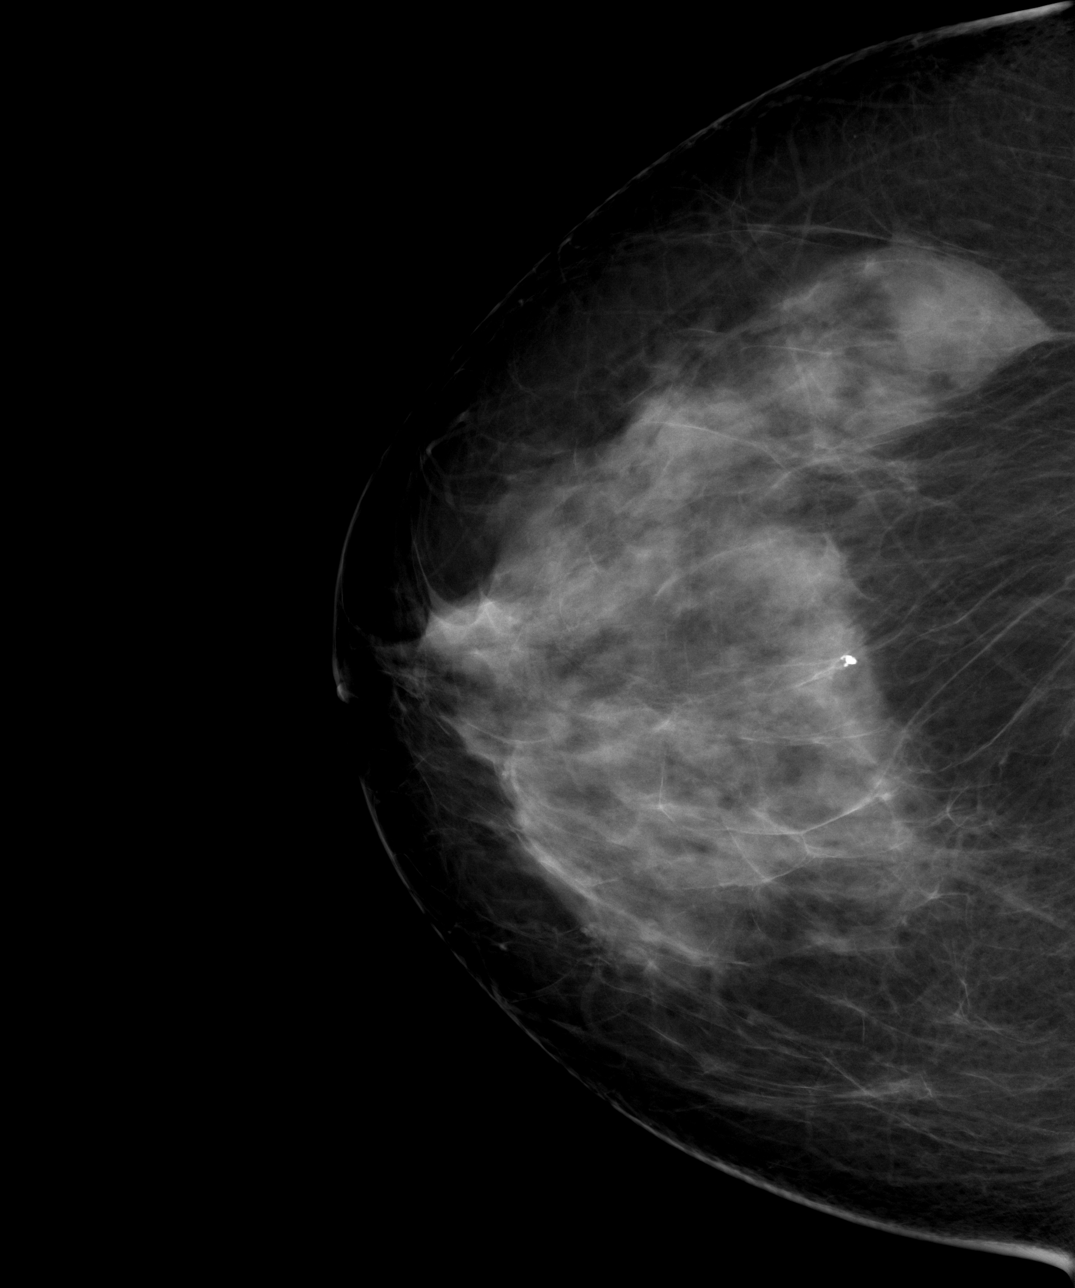
[im 2/4]
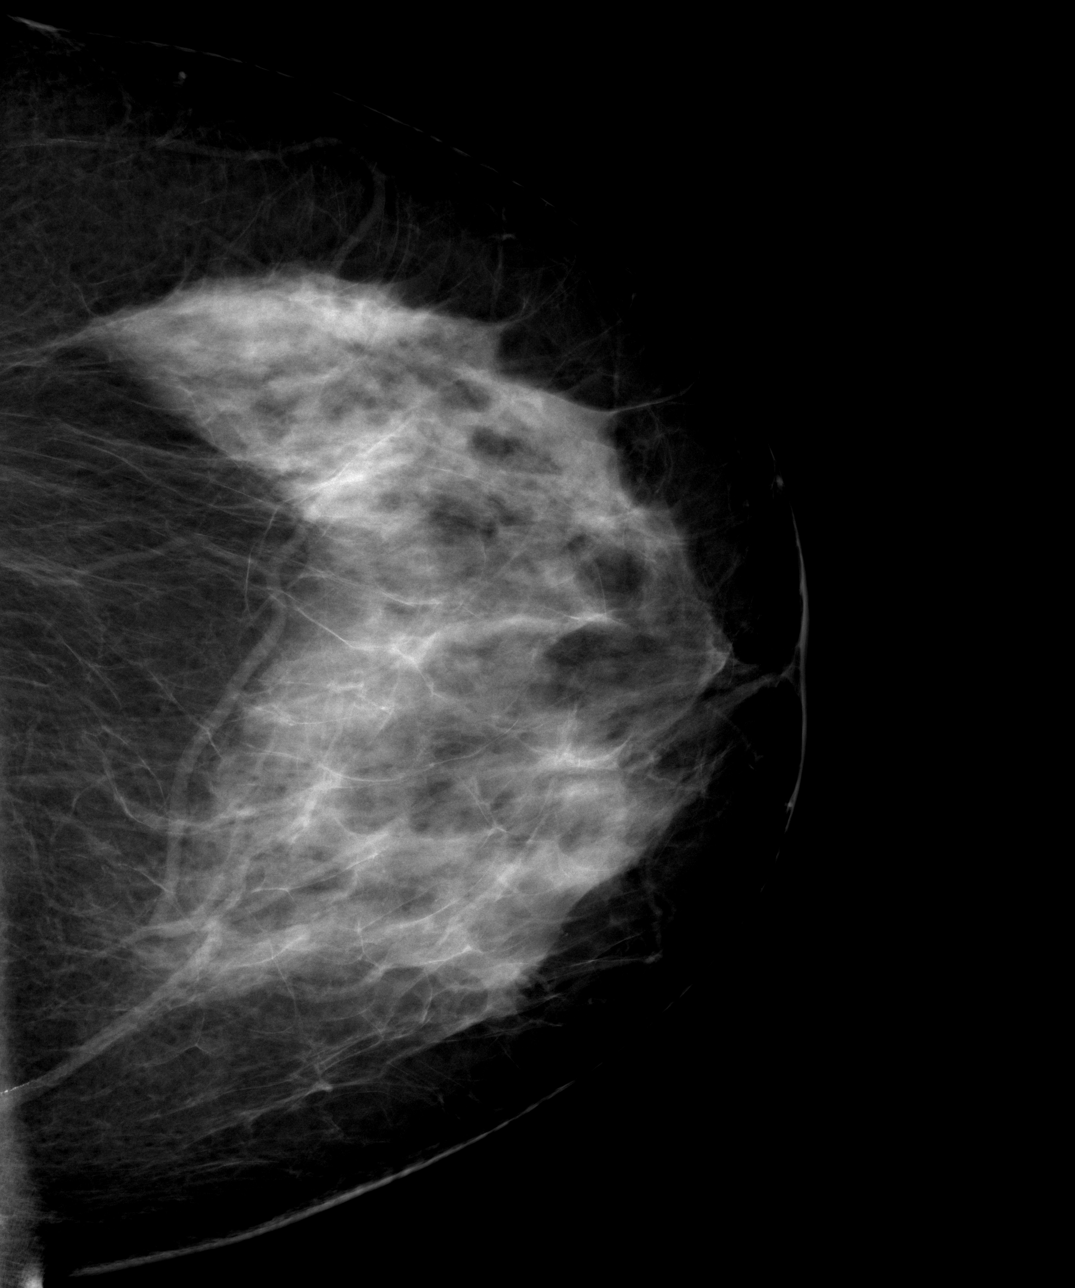
[im 3/4]
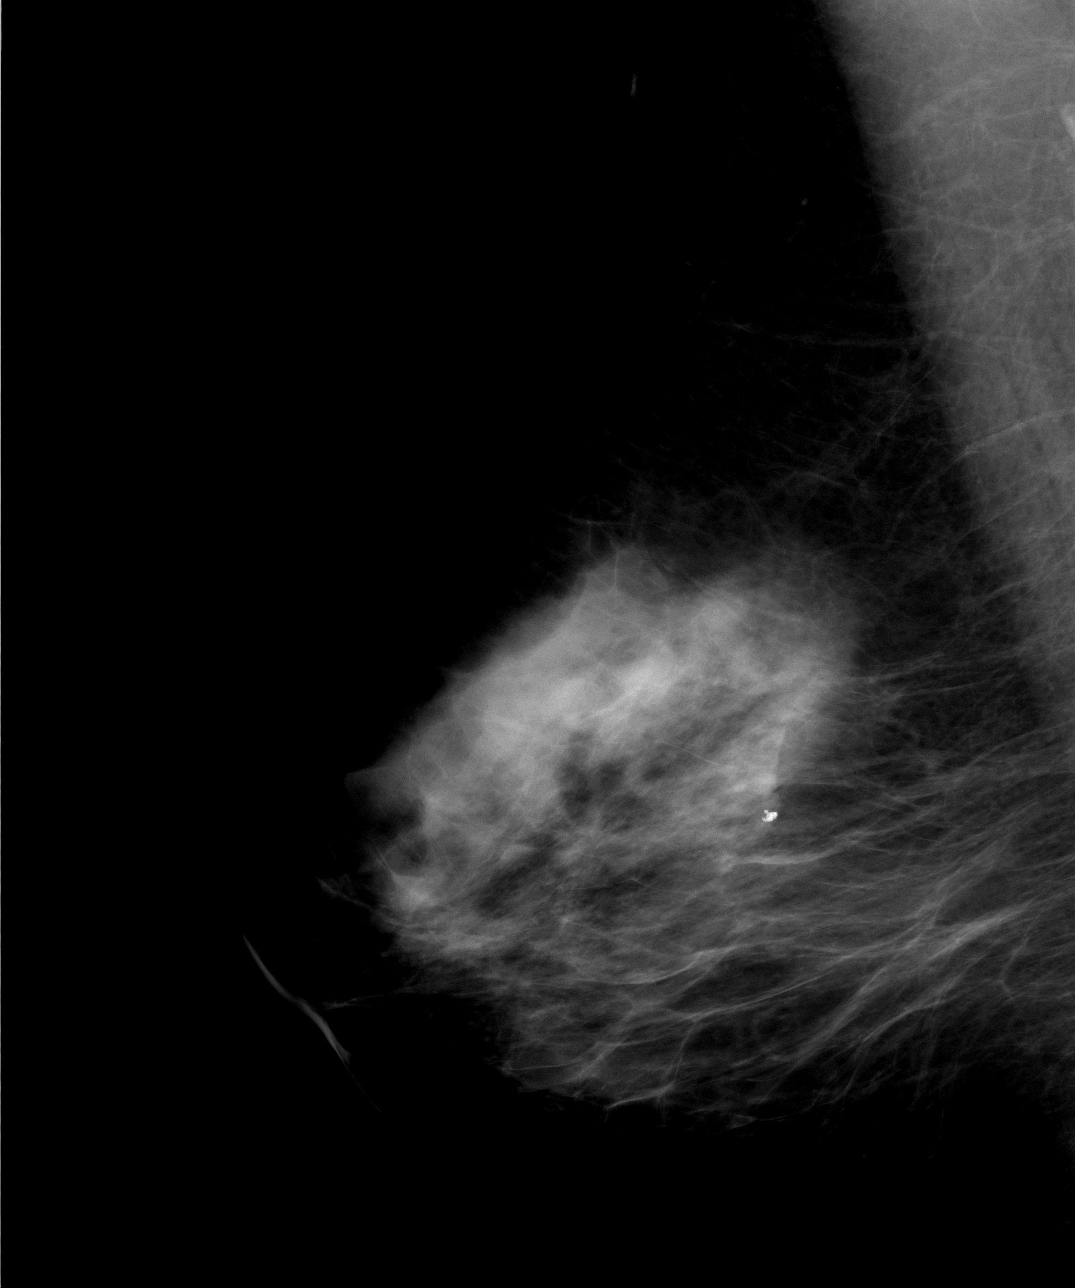
[im 4/4]
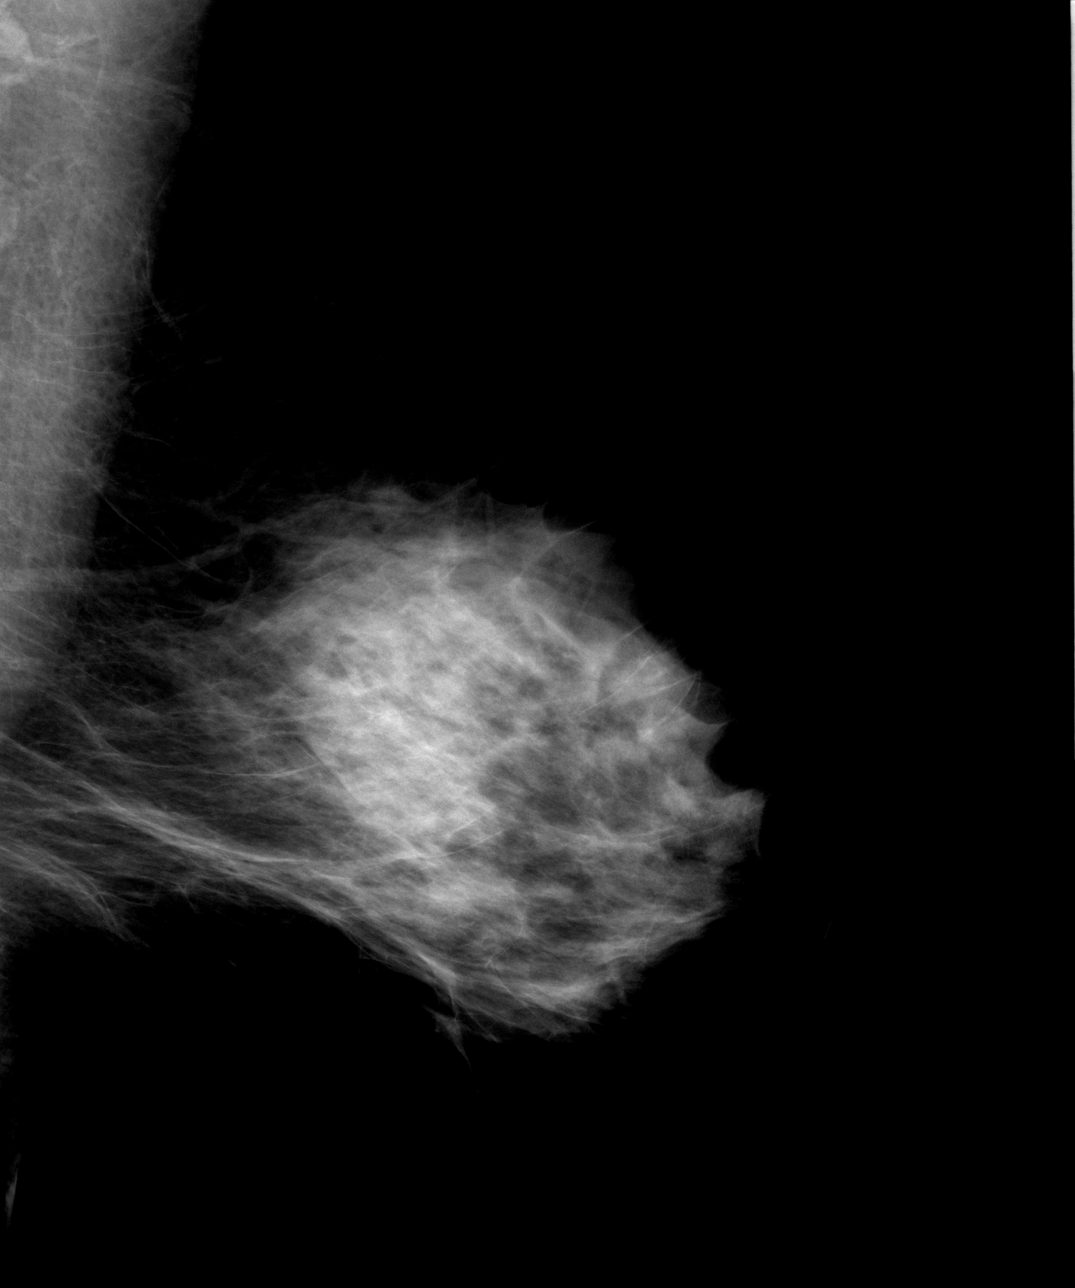

[4 of 4 positions shown; findings below may reference images not displayed]

FINDINGS: Breast parenchyma is heterogeneously dense. There is no mass or suspicious cluster of microcalcifications. There is no architectural distortion, skin thickening or nipple retraction.
IMPRESSION: 1.
BIRADS 2-Benign findings. Patient has been added in a reminder system with a
target date for the next screening mammography.
2.
DENSITY CODE   C (Heterogeneously dense)
Final Assessment Code:
Bi-Rads 2 

BI-RADS 0
Need additional imaging evaluation
BI-RADS 1
Negative mammogram
BI-RADS 2
Benign finding
BI-RADS 3
Probably benign finding: short-interval follow-up suggested
BI-RADS 4
Suspicious abnormality:  biopsy should be considered
BI-RADS 5
Highly suggestive of malignancy; appropriate action should be taken
BI-RADS 6
Known Biopsy-proven Malignancy  Appropriate action should be taken
NOTE:
In compliance with Federal regulations, the results of this mammogram are being sent to the patient.

------------- REPORT [id number] -------------
[facility]
[address]
Mor Ms.[place]I, [place]:
We wish to report the following on your recent mammography examination. We are sending a report to your referring physician or other health care provider. 
(       Normal/Negative:
No evidence of cancer.
This statement is mandated by the [place], Department of Health.
Your examination was performed by one of our technologists, who are registered radiological technologists and also specially certified in mammography:
___
Donjuan, Wavy (M)
___
Tena, Danielf (M)
___
Gia, Nelith (M)

Your mammogram was interpreted by our radiologist.

( 
Mmamontsho Seakolo, M.D.

(Annual Breast Examination by a physician or other health care provider
(Annual Mammography Screening beginning at age 40
(Monthly Breast Self Examination

## 2015-06-07 IMAGING — MG 3D SCREENING DIGITAL MAMMOGRAM WITH CAD
5 series · 7 of 24 positions shown · non-contrast
Comparison: 02/27/2016 and 02/23/2015.

------------- REPORT [id number] -------------
FINKEL, NISHIT

Exam:  
Annual screening digital mammogram with 3D Tomosynthesis with CAD
INDICATION: Screening.

[Series 3151: R CC · right · 0.10mm/px · 2 of 2 slices shown]
[im 1/2]
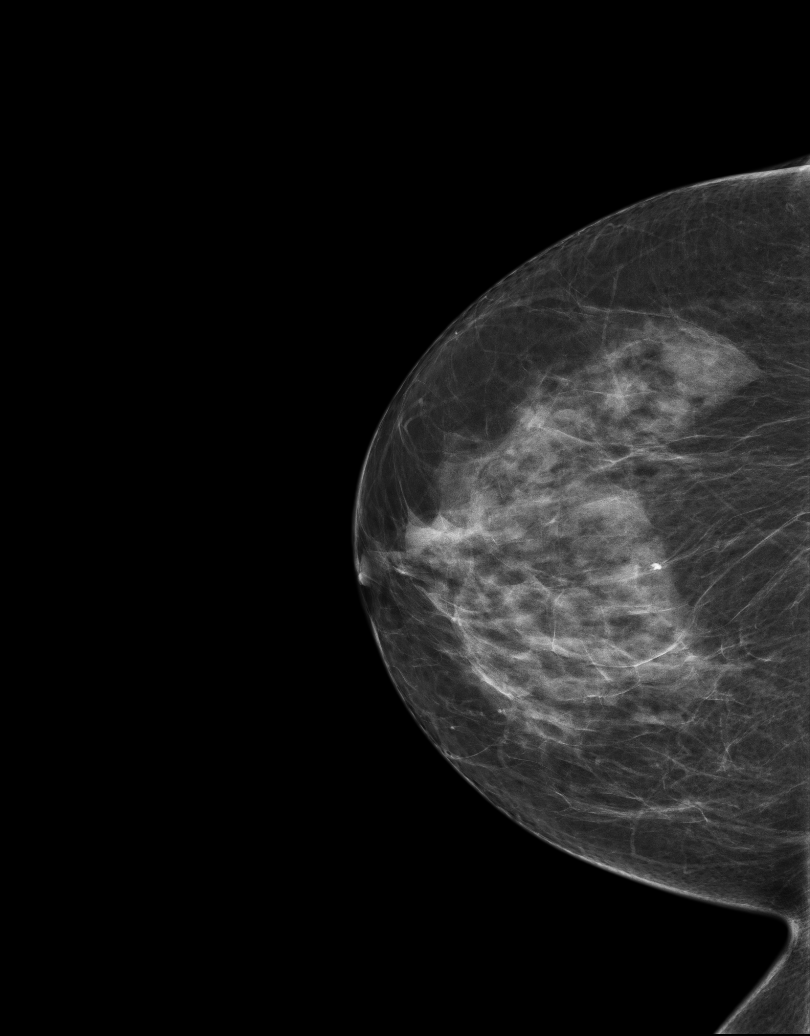
[im 2/2]
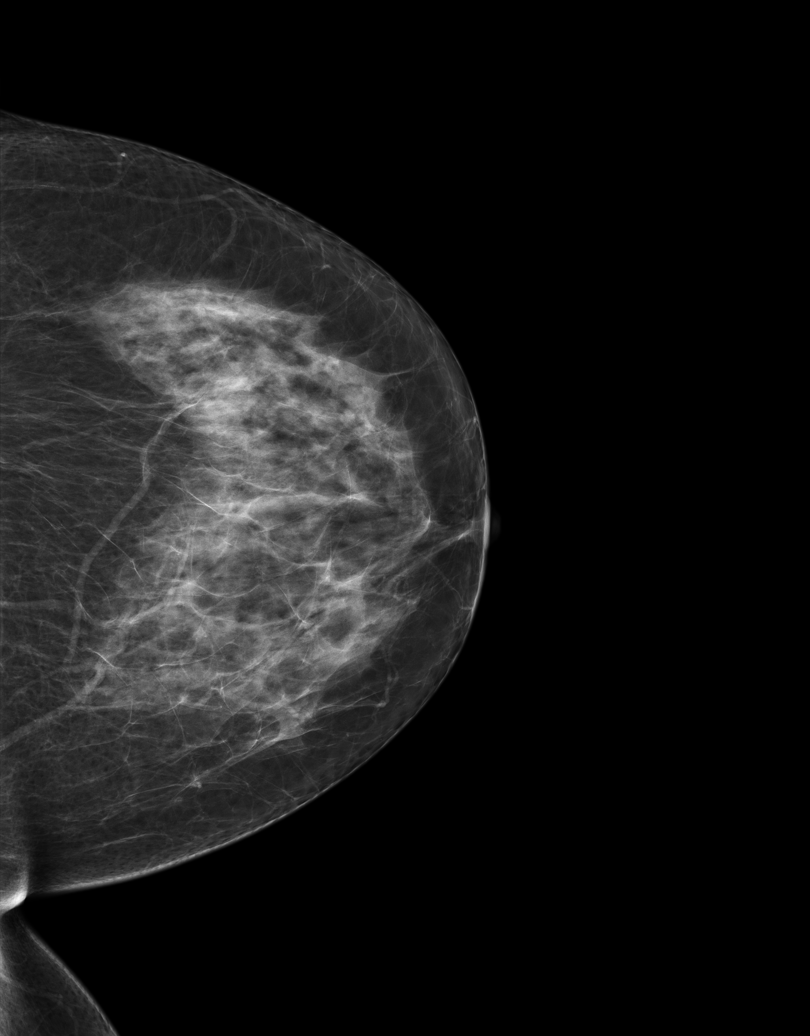

[Series 3153: 3D SCREENING DIGITAL MAMMOGRAM WITH CAD · 2 acquisitions, 2 frames shown (1 of 2)]
[im 1/2]
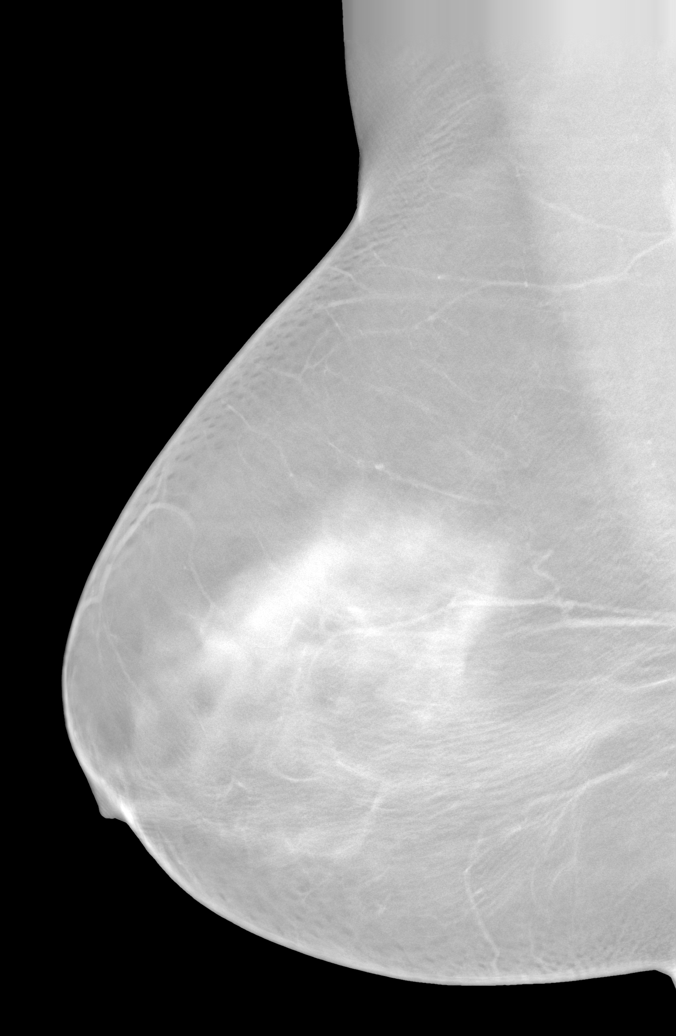
[im 2/2]
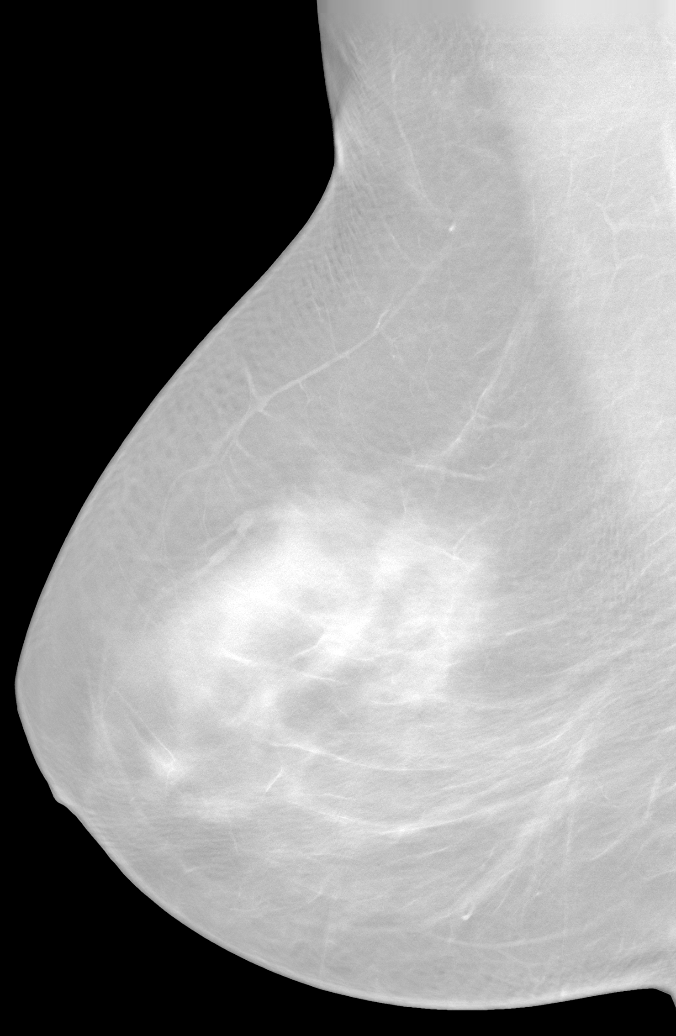

[3D SCREENING DIGITAL MAMMOGRAM WITH CAD (2 of 2) · tomo slice 11/71.0]
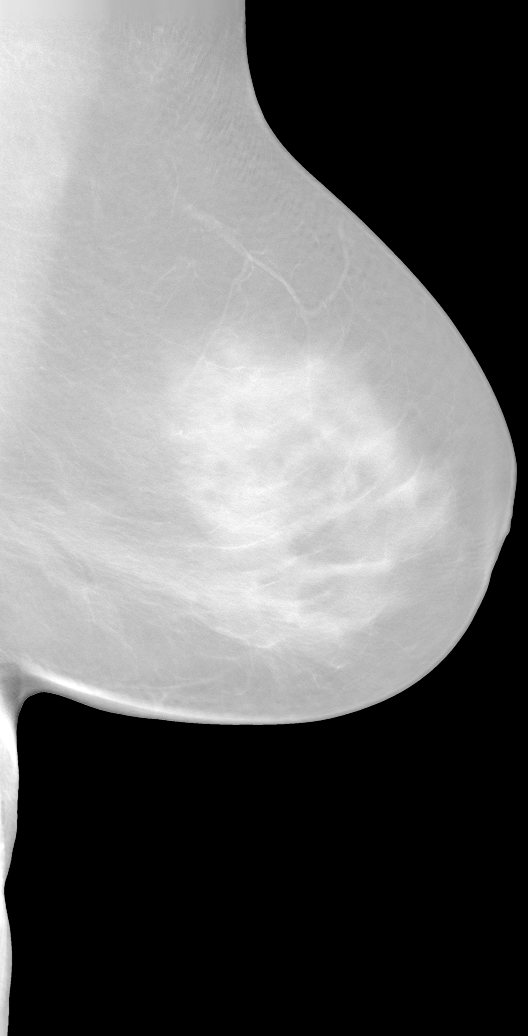

[L]
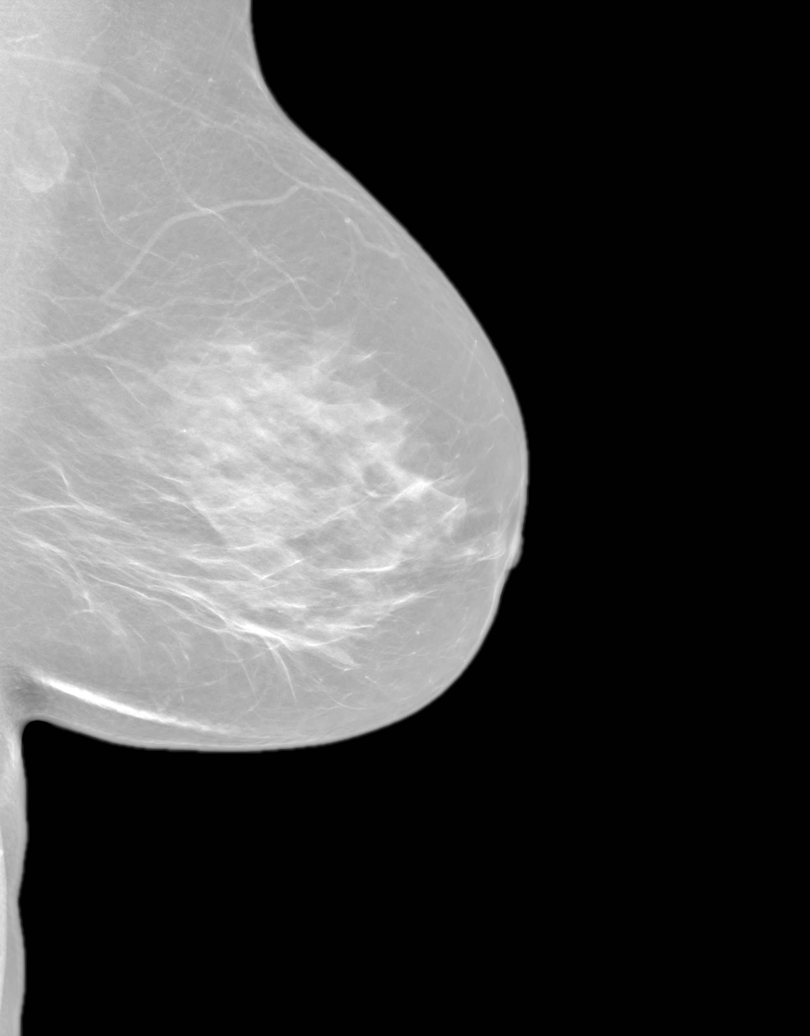

[R]
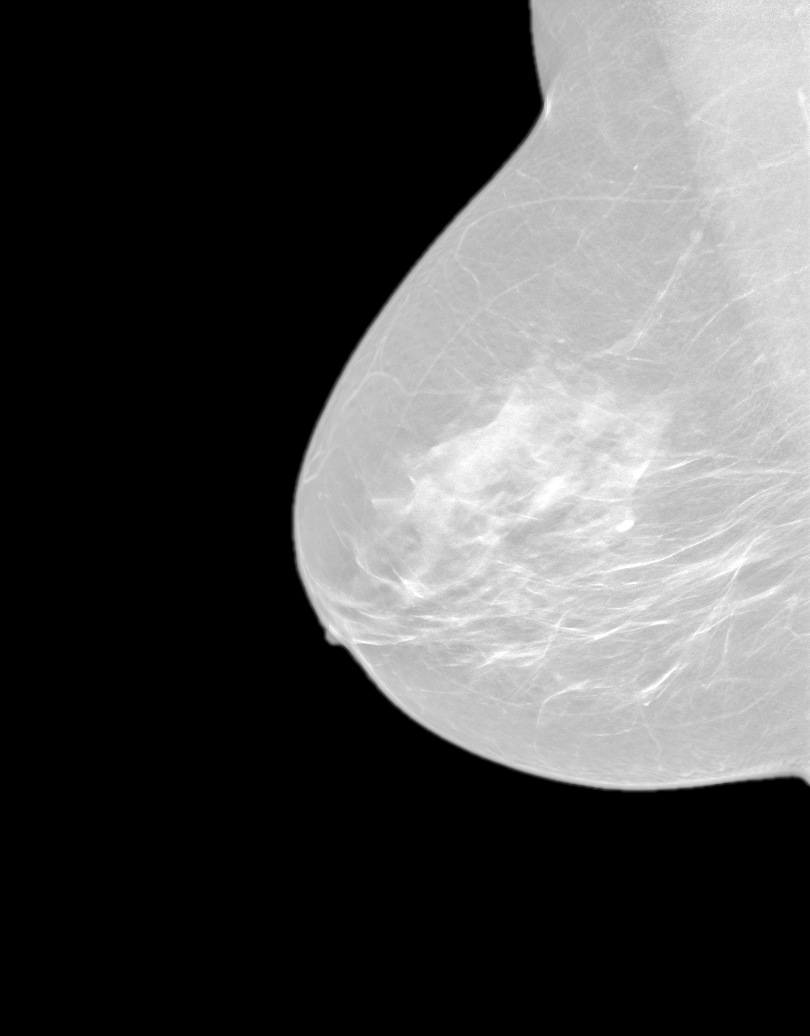

[7 of 24 positions shown; findings below may reference images not displayed]

FINDINGS: Breast parenchyma is heterogeneously dense. There is no mass or suspicious cluster of microcalcifications. There is no architectural distortion, skin thickening or nipple retraction.
IMPRESSION: BIRADS 2-Benign findings. Patient has been added in a reminder system with a

target date for the next screening mammography.

DENSITY CODE –  C (Heterogeneously dense). 

Final Assessment Code:

Bi-Rads 2 

BI-RADS 0
Need additional imaging evaluation

BI-RADS 1
Negative mammogram

BI-RADS 2
Benign finding

BI-RADS 3
Probably benign finding: short-interval follow-up suggested

BI-RADS 4
Suspicious abnormality:  biopsy should be considered

BI-RADS 5
Highly suggestive of malignancy; appropriate action should be taken

BI-RADS 6
Known Biopsy-proven Malignancy – Appropriate action should be taken

NOTE:
In compliance with Federal regulations, the results of this mammogram are being sent to the patient.

------------- REPORT [id number] -------------
[facility]
[address]
Daina Ms.[place]STARR, [place]:
We wish to report the following on your recent mammography examination. We are sending a report to your referring physician or other health care provider. 
(       Normal/Negative:
No evidence of cancer.
This statement is mandated by the [place], Department of Health.
Your examination was performed by one of our technologists, who are registered radiological technologists and also specially certified in mammography:
___
Parlak, Edaly (M)
___
Dang, Mcalex (M)

Your mammogram was interpreted by our radiologist.

( 
Sofeine Made, M.D.

(Annual Breast Examination by a physician or other health care provider
(Annual Mammography Screening beginning at age 40
(Monthly Breast Self Examination

## 2016-06-09 IMAGING — MG 3D SCREENING MAMMO BIL W/CAD
5 series · 7 of 24 positions shown · non-contrast
Comparison: 05/24/2016 and 05/24/2015.

------------- REPORT [id number] -------------
FINKEL, NISHIT

Exam:  
Annual screening digital mammogram with 3D Tomosynthesis with CAD
INDICATION: Screening.

[R]
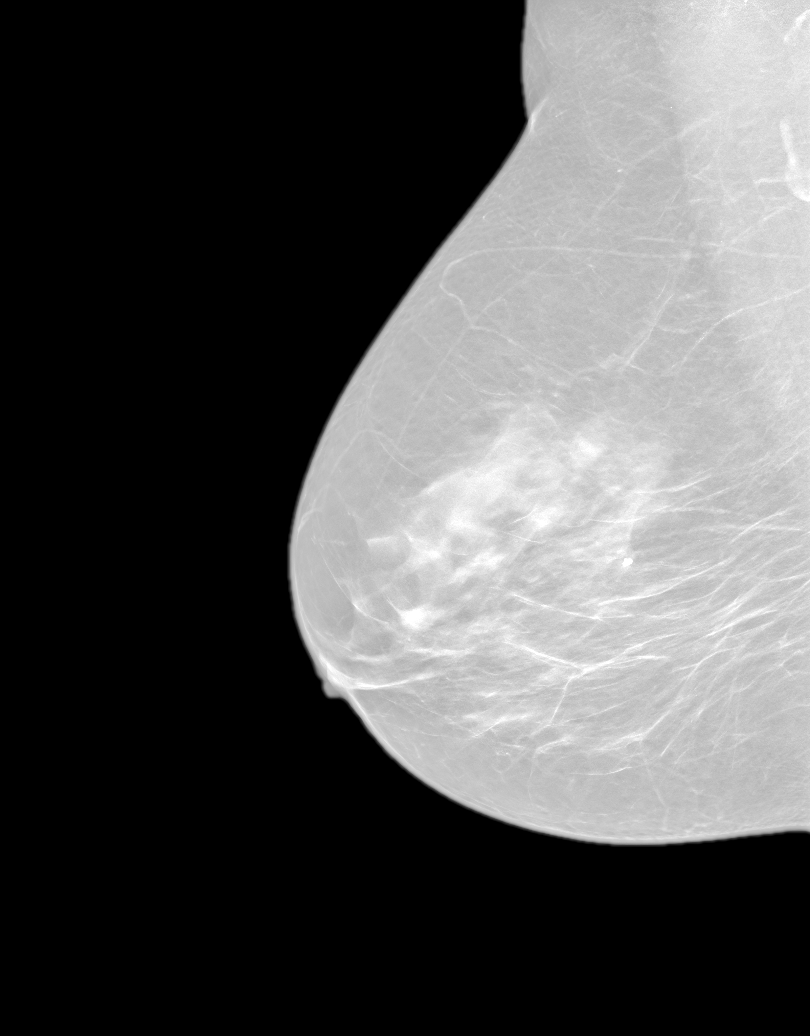

[R CC · right · 0.10mm/px · 2 of 2 slices shown]
[im 1/2]
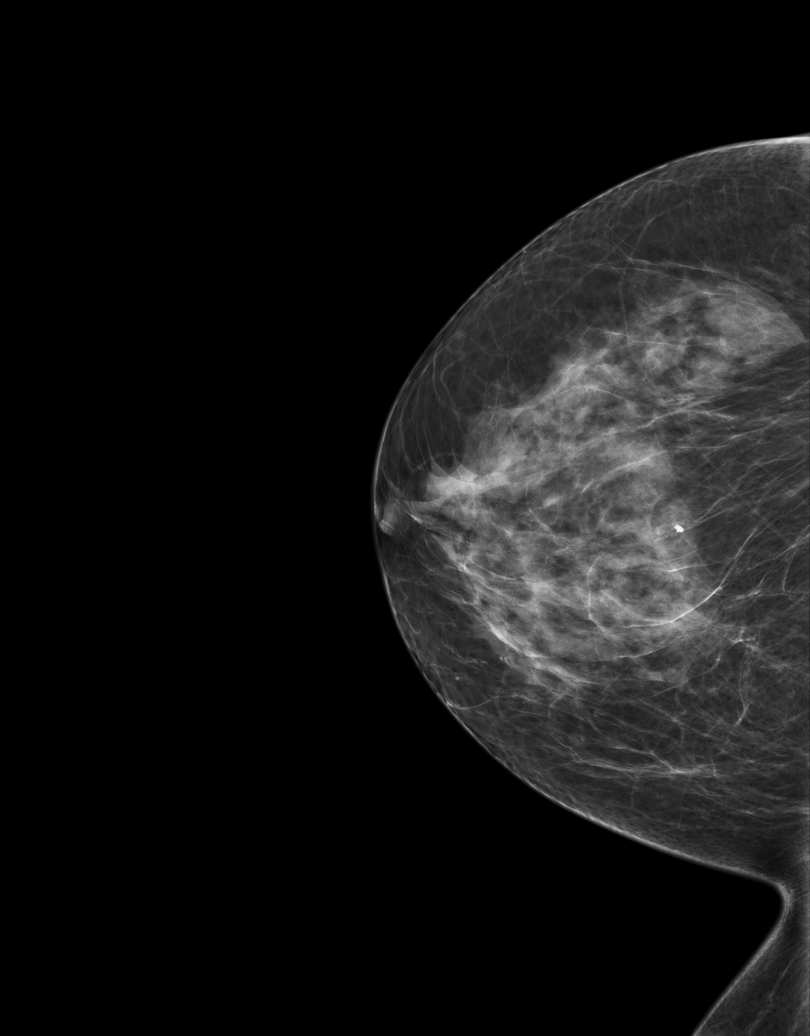
[im 2/2]
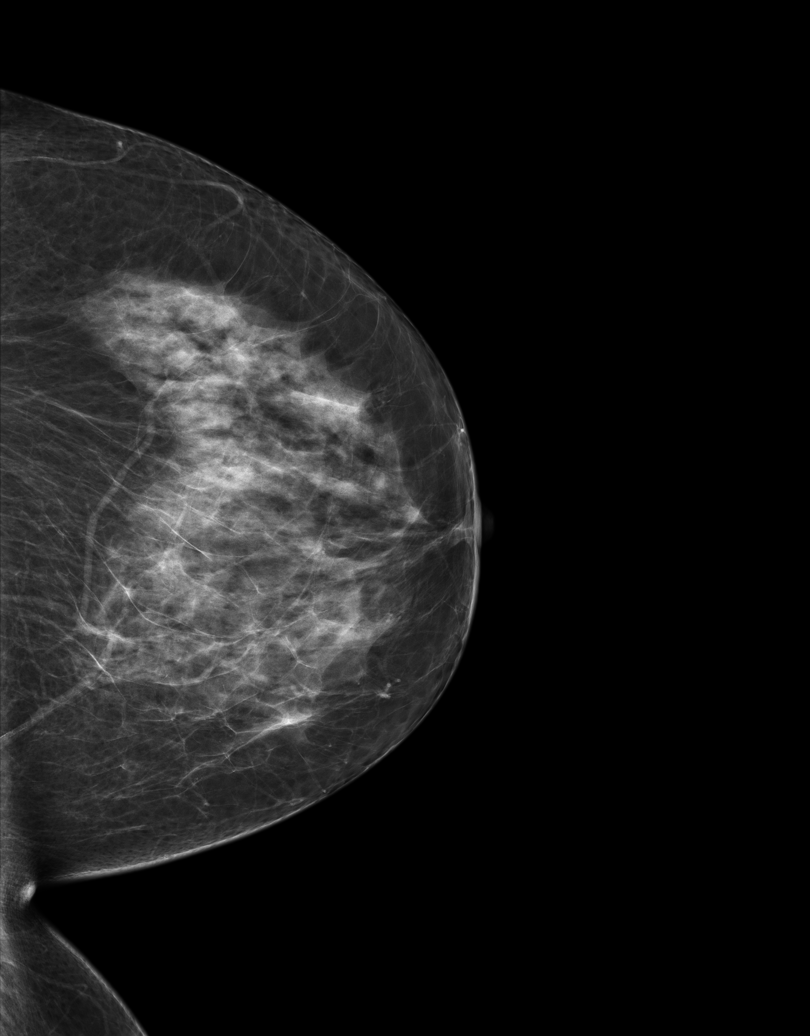

[3D SCREENING MAMMO BIL W/CAD · 2 acquisitions, 2 frames shown (1 of 2)]
[im 1/2]
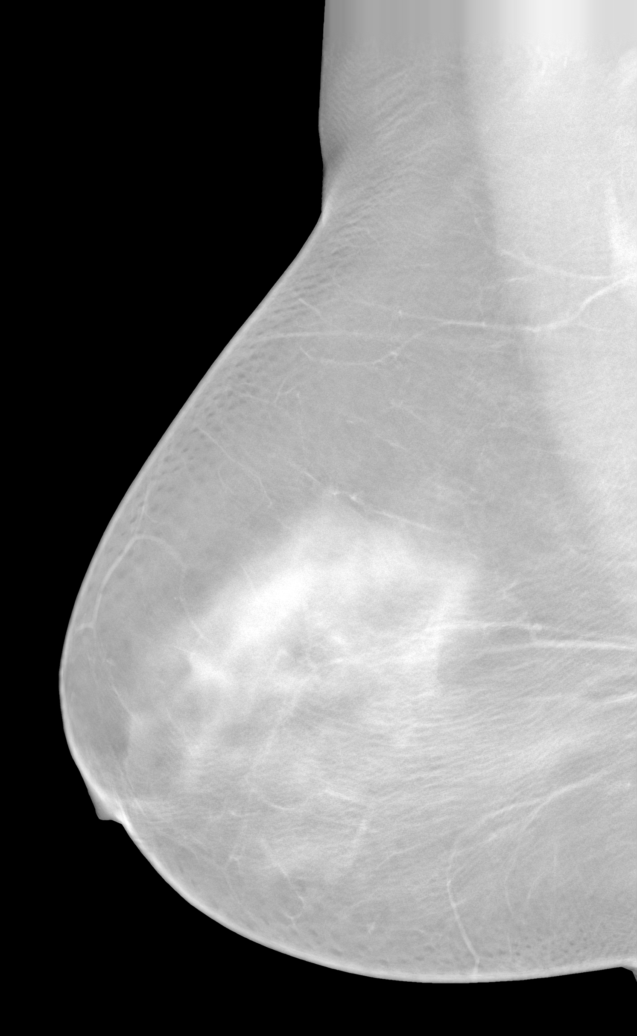
[im 2/2]
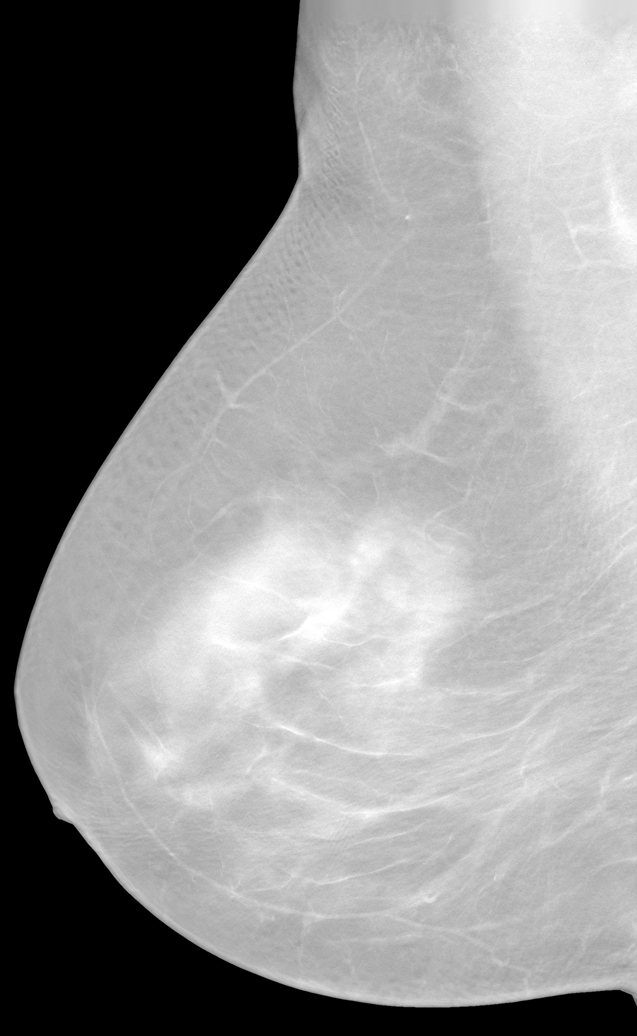

[3D SCREENING MAMMO BIL W/CAD (2 of 2) · tomo slice 12/72.0]
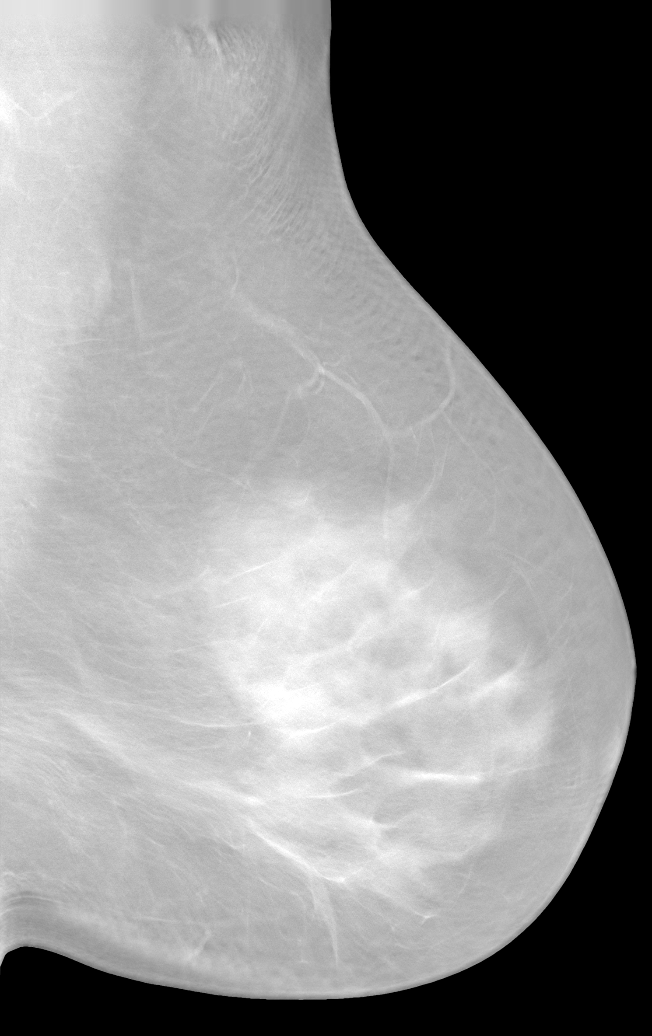

[L]
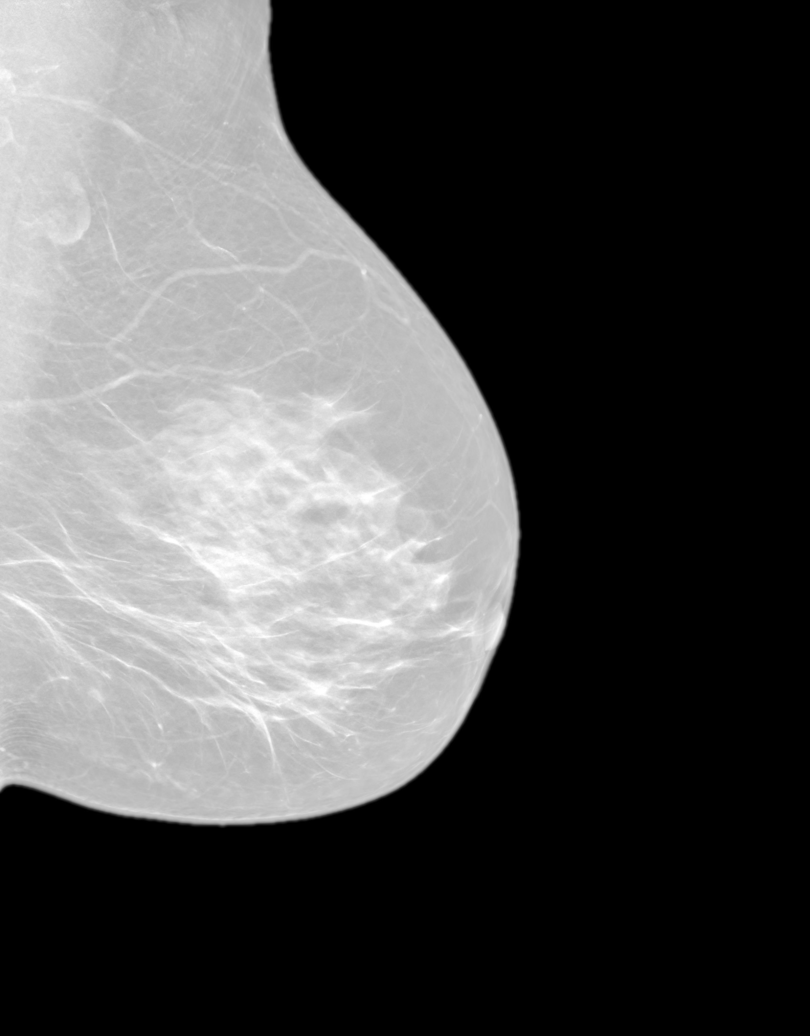

[7 of 24 positions shown; findings below may reference images not displayed]

FINDINGS: Breast parenchyma is heterogeneously dense. There is no mass or suspicious cluster of microcalcifications. There is no architectural distortion, skin thickening or nipple retraction.
IMPRESSION: BIRADS 2-Benign findings. Patient has been added in a reminder system with a

target date for the next screening mammography.

DENSITY CODE –  C (Heterogeneously dense). 

Final Assessment Code:

Bi-Rads 2 

BI-RADS 0
Need additional imaging evaluation

BI-RADS 1
Negative mammogram

BI-RADS 2
Benign finding

BI-RADS 3
Probably benign finding: short-interval follow-up suggested

BI-RADS 4
Suspicious abnormality:  biopsy should be considered

BI-RADS 5
Highly suggestive of malignancy; appropriate action should be taken

BI-RADS 6
Known Biopsy-proven Malignancy – Appropriate action should be taken

NOTE:
In compliance with Federal regulations, the results of this mammogram are being sent to the patient.

------------- REPORT [id number] -------------
[facility]
[address]
Daina Ms.ALLSTARR, [place]:
We wish to report the following on your recent mammography examination. We are sending a report to your referring physician or other health care provider. 
(       Normal/Negative:
No evidence of cancer.
This statement is mandated by the [place], Department of Health.
Your examination was performed by one of our technologists, who are registered radiological technologists and also specially certified in mammography:
___
Parlak, Edaly (M)
___
Dang, Mcalex (M)

Your mammogram was interpreted by our radiologist.

( 
Sofeine Made, M.D.

(Annual Breast Examination by a physician or other health care provider
(Annual Mammography Screening beginning at age 40
(Monthly Breast Self Examination

## 2017-06-21 IMAGING — MG 3D SCREENING MAMMO BIL W/CAD
5 series · 6 of 24 positions shown · non-contrast
Comparison: 05/29/2017 and 05/26/2016.

------------- REPORT [id number] -------------
PATIENT REGISTRATION FORM

Scheduled Modality:
MG
Date Scheduled:
Referring Physician: 
PATIENT INFORMATION
 Mr.
 Mrs.
 Miss  Ms.
Email address:
[place], [place]
Social Security:  
Age:
Sex:
F
Mailing Address:
Home Phone
Cell Phone 
Study Type:
Diagnosis / Symptoms:
Insurance Authorization:
prev Masrafe Karma Kar routine
Notes/Special Instructions: 
INSURANCE INFORMATION
(Please give your insurance card to the receptionist.)
Name of  primary insurance
[employer]
Subscriber’s S.S. #
Group #
Insurance ID # 
Co-payment:
$
Patient’s relationship to subscriber:
 Self
 Spouse
 Child
 Other
Name of secondary insurance (if applicable):
Insurance ID #
[employer]
IN CASE OF EMERGENCY
Name of friend or relative to call in case of emergency:
Relationship to patient:
Home phone #
Work phone #
The above information is true to the best of my knowledge. I authorize my insurance benefits be paid directly to the physician. I understand that I am financially responsible for any balance. I also authorize [employer] or insurance company to release any information required to process my claims.
Patient/Guardian signature
Date
------------- REPORT [id number] -------------
[facility]
[address]
Daina Ms.ALLSTARR, [place]:
We wish to report the following on your recent mammography examination. We are sending a report to your referring physician or other health care provider. 
(       Normal/Negative:
No evidence of cancer.
This statement is mandated by the [place], Department of Health.
Your examination was performed by one of our technologists, who are registered radiological technologists and also specially certified in mammography:
___
Parlak, Edaly (M)
Dang, Mcalex (M)
Your mammogram was interpreted by our radiologist.
( 
Sofeine Made, M.D.
(Annual Breast Examination by a physician or other health care provider
(Annual Mammography Screening beginning at age 40
(Monthly Breast Self Examination
------------- REPORT [id number] -------------
SHIM, SENA
EXAM:  3D BILATERAL ANNUAL SCREENING DIGITAL MAMMOGRAM WITH TOMOSYNTHESIS AND CAD
INDICATION: Screening.

[Series 5627: R CC · right · 0.10mm/px · 2 of 2 slices shown]
[im 1/2]
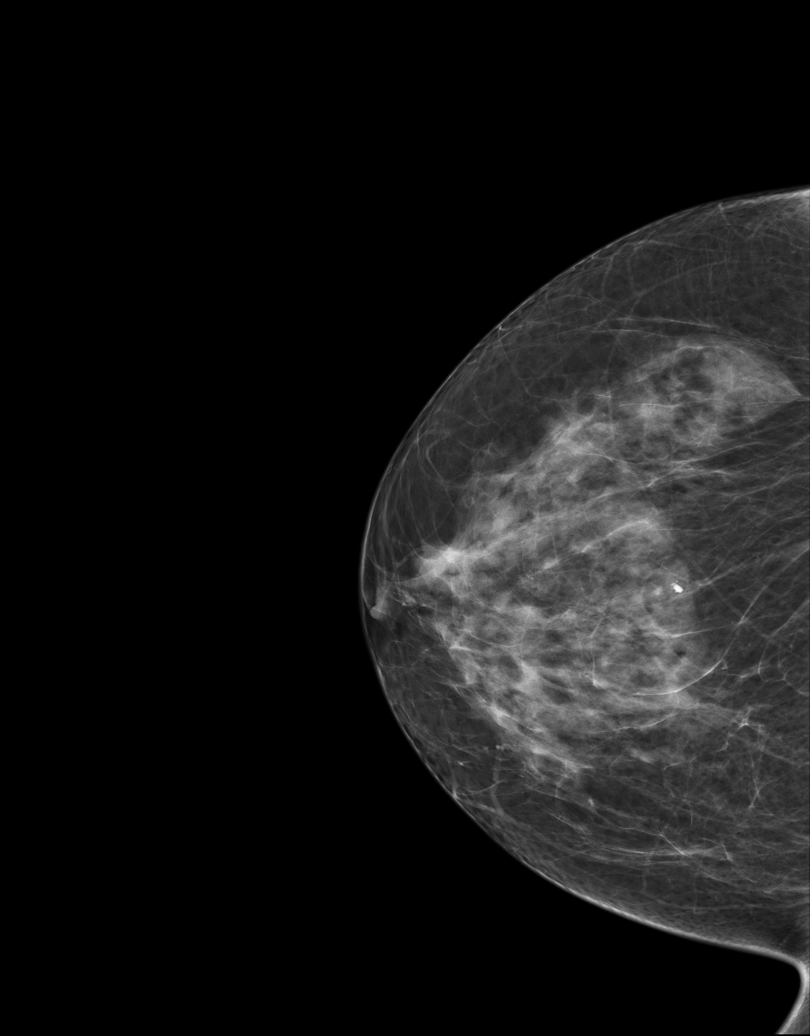
[im 2/2]
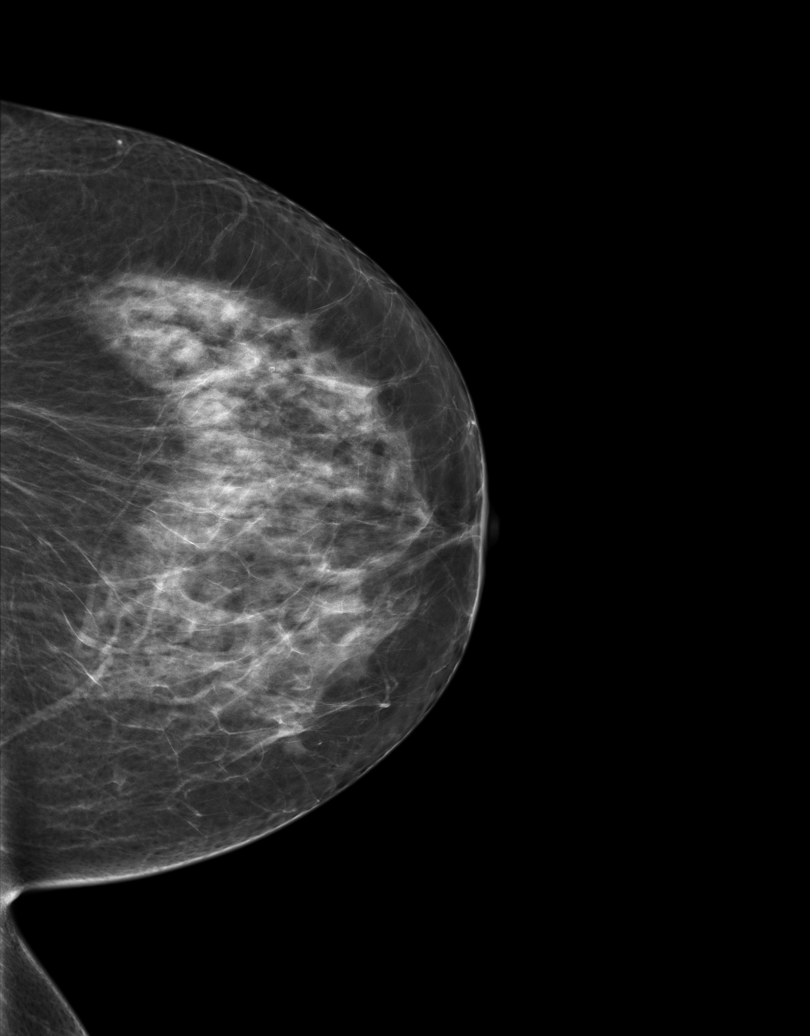

[3D SCREENING MAMMO BIL W/CAD (1 of 2) · tomo slice 11/67.0]
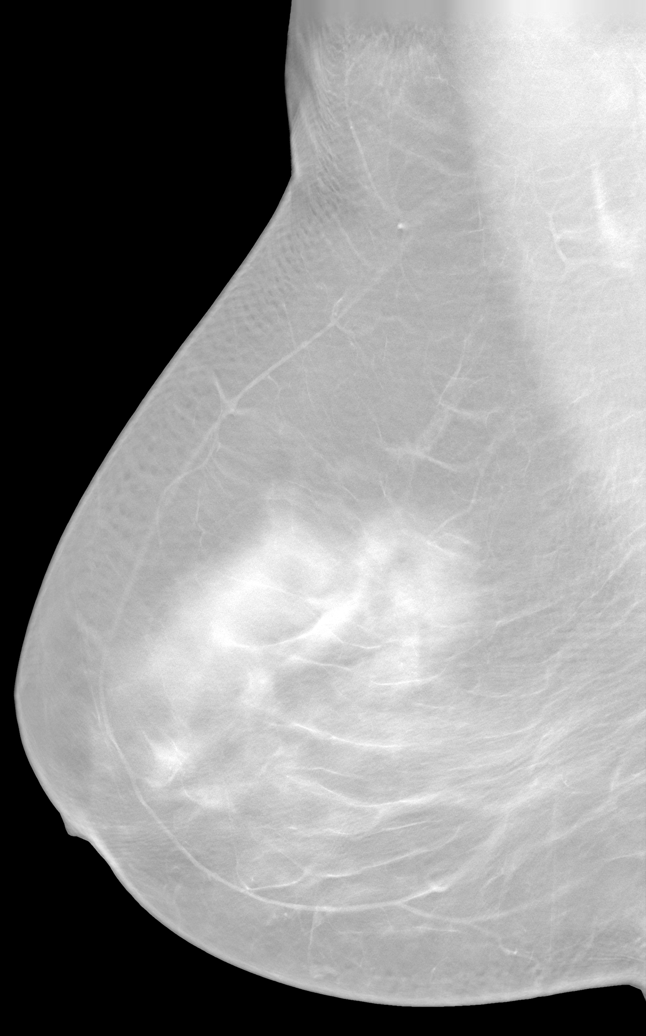

[R]
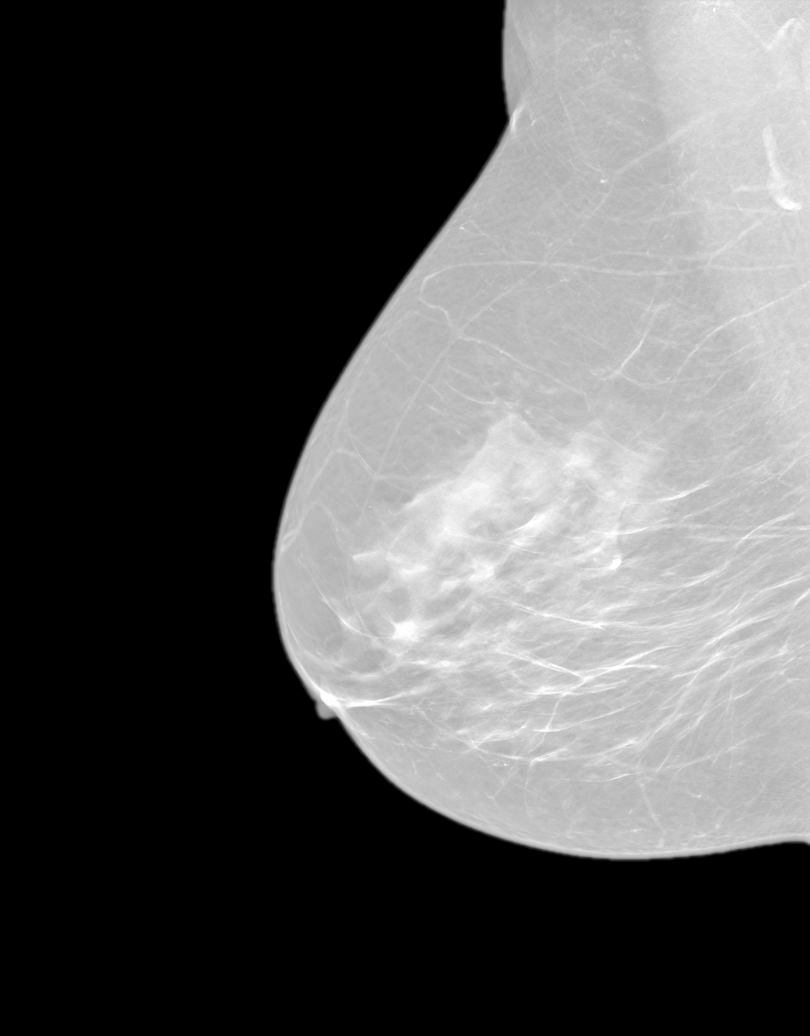

[3D SCREENING MAMMO BIL W/CAD (2 of 2) · tomo slice 11/69.0]
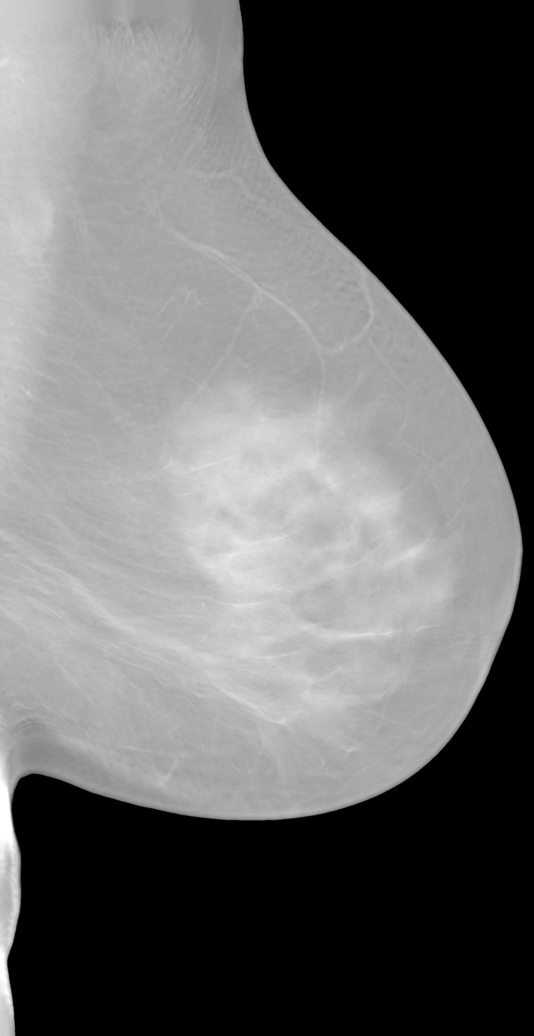

[L]
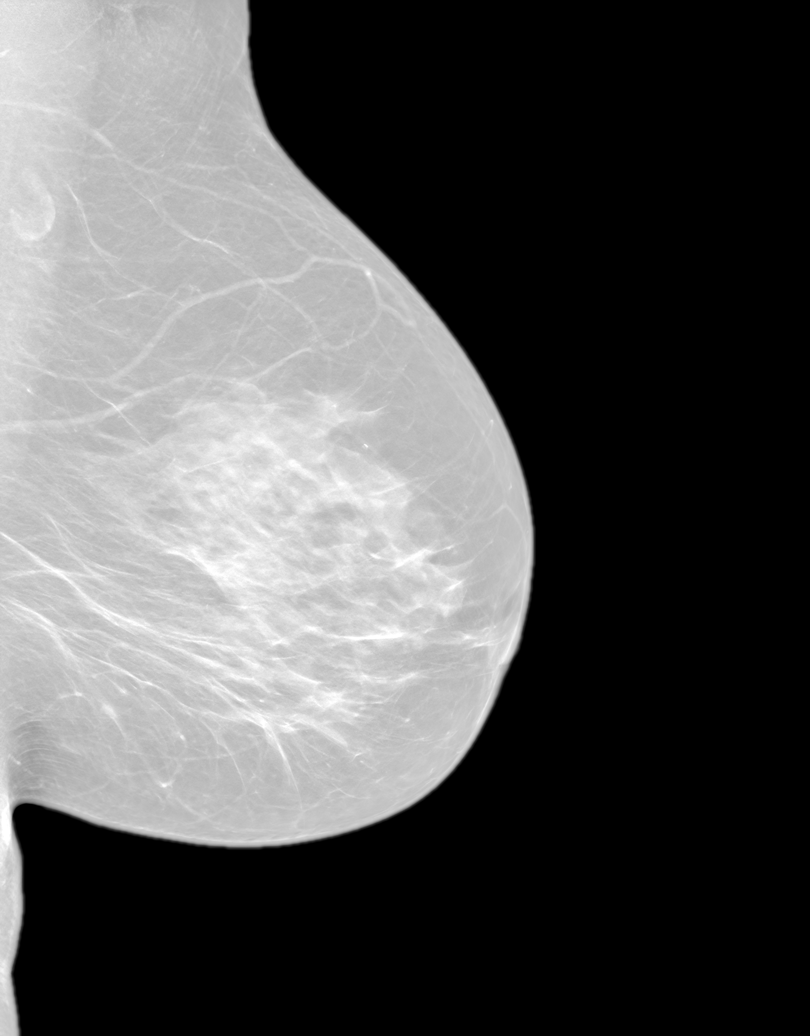

[6 of 24 positions shown; findings below may reference images not displayed]

FINDINGS: Breast parenchyma is heterogeneously dense.  There is no mass or suspicious cluster of microcalcifications.   There is no architectural distortion, skin thickening or nipple retraction.
IMPRESSION: 1.  BIRADS 2-Benign findings. Patient has been added in a reminder system with a target date for the next screening mammography.

2.  DENSITY CODE –  C (Heterogeneously dense).

Final Assessment Code:

Bi-Rads 2 

BI-RADS 0
Need additional imaging evaluation

BI-RADS 1
Negative mammogram

BI-RADS 2
Benign finding

BI-RADS 3
Probably benign finding: short-interval follow-up suggested

BI-RADS 4
Suspicious abnormality:  biopsy should be considered

BI-RADS 5
Highly suggestive of malignancy; appropriate action should be taken

BI-RADS 6
Known Biopsy-proven Malignancy – Appropriate action should be taken

NOTE:
In compliance with Federal regulations, the results of this mammogram are being sent to the patient.

## 2018-06-28 IMAGING — MG 3D SCREENING MAMMO BIL W/CAD
5 series · 7 of 24 positions shown · non-contrast
Comparison: 05/26/2018 and 05/11/2017.

------------- REPORT [id number] -------------
[facility]
[address]
Daina Ms.ALLSTARR, [place]:
We wish to report the following on your recent mammography examination. We are sending a report to your referring physician or other health care provider. 
(       Normal/Negative:
No evidence of cancer.
This statement is mandated by the [place], Department of Health.
Your examination was performed by one of our technologists, who are registered radiological technologists and also specially certified in mammography:
___
Parlak, Edaly (M)
Dang, Mcalex (M)

Your mammogram was interpreted by our radiologist.
( 
Sofeine Made, M.D.
(Annual Breast Examination by a physician or other health care provider
(Annual Mammography Screening beginning at age 40
(Monthly Breast Self Examination
------------- REPORT [id number] -------------
ZUBAIR, REGINALD
EXAM:  3D BILATERAL ANNUAL SCREENING DIGITAL MAMMOGRAM WITH TOMOSYNTHESIS AND CAD
INDICATION: Screening.

[R CC · right · 0.10mm/px · 2 of 2 slices shown]
[im 1/2]
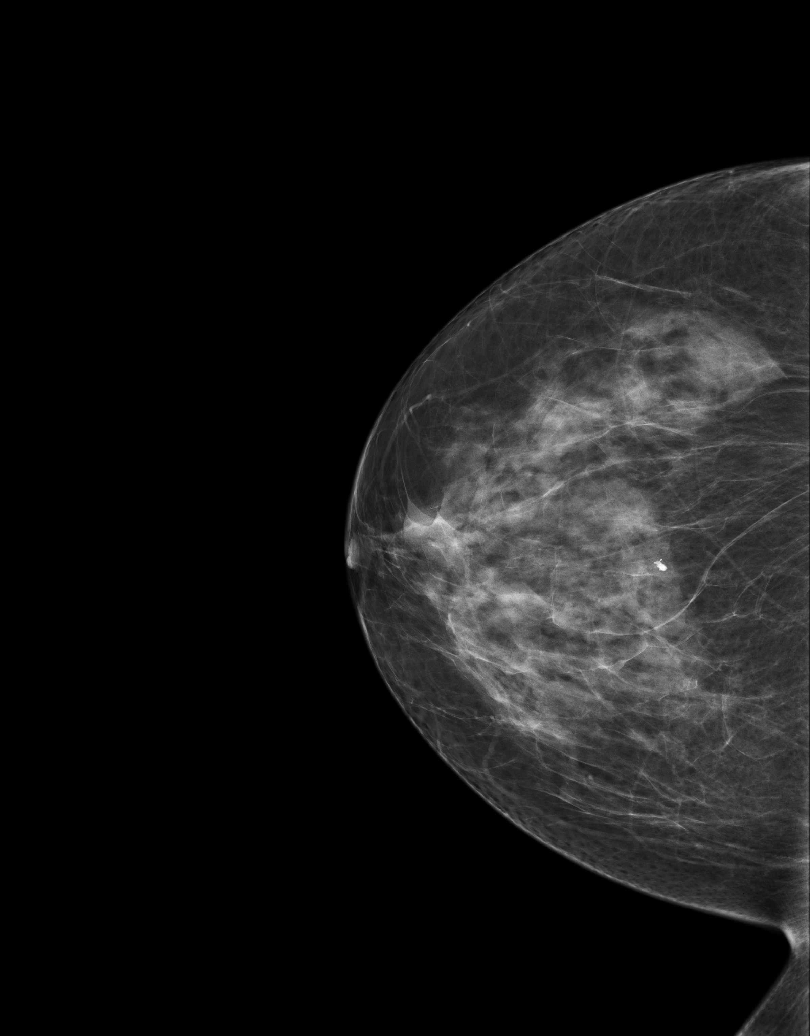
[im 2/2]
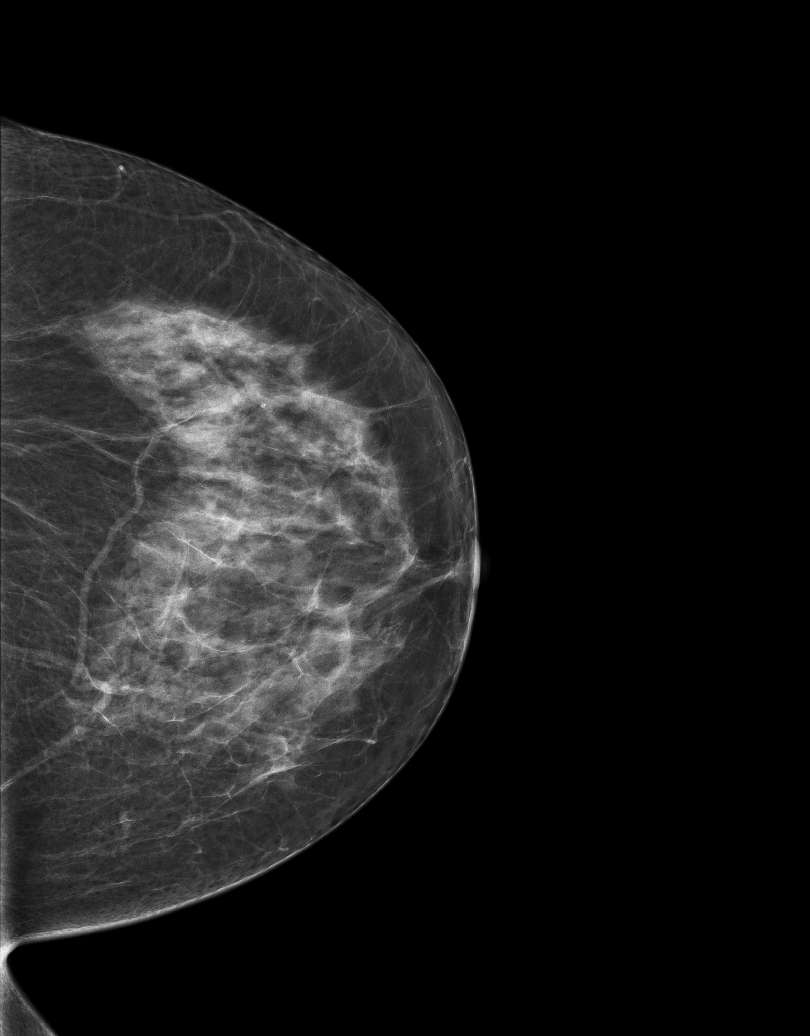

[3D SCREENING MAMMO BIL W/CAD · 2 acquisitions, 2 frames shown (1 of 2)]
[im 1/2]
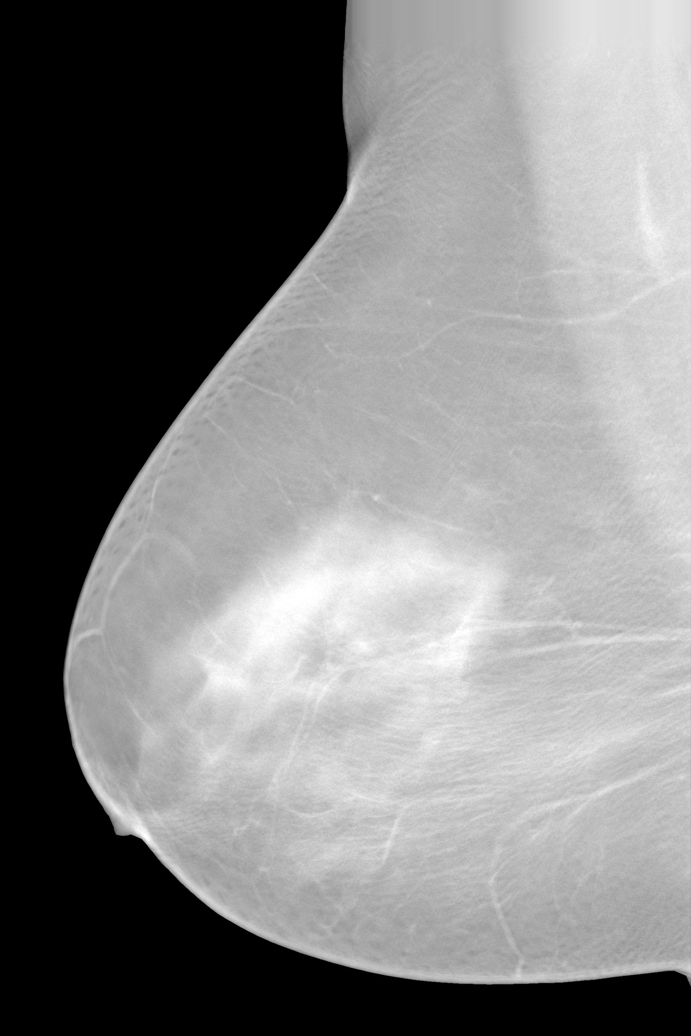
[im 2/2]
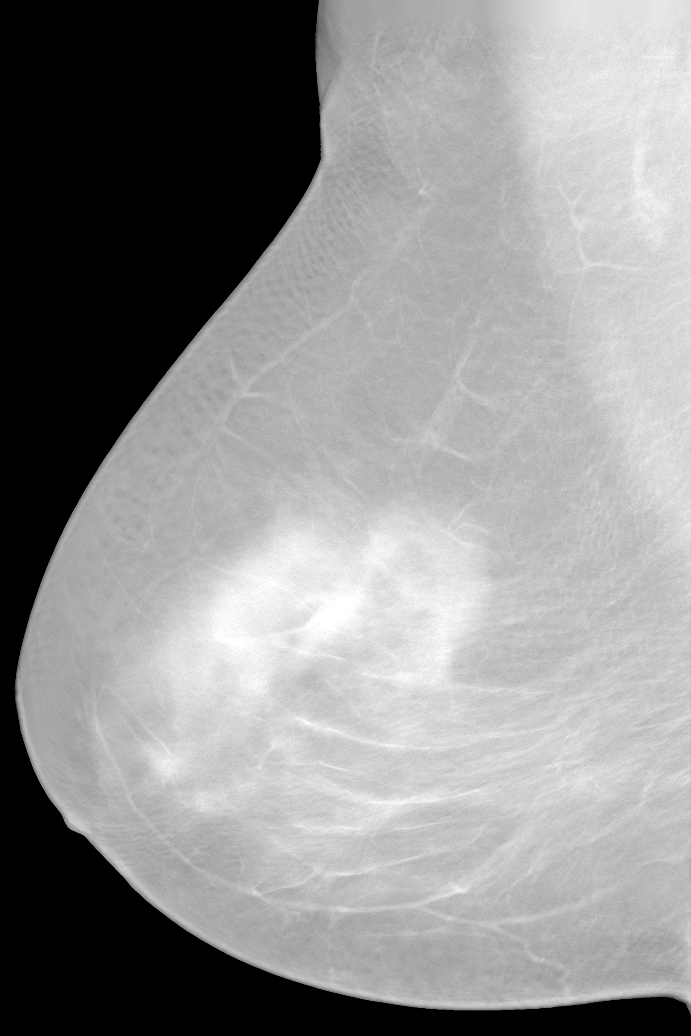

[3D SCREENING MAMMO BIL W/CAD (2 of 2) · tomo slice 11/70.0]
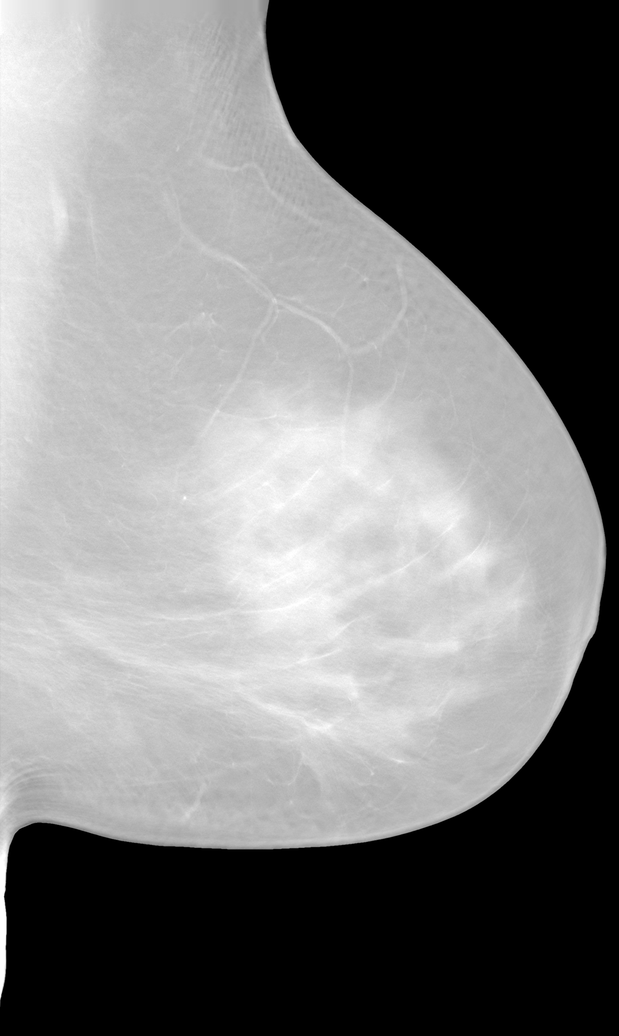

[R]
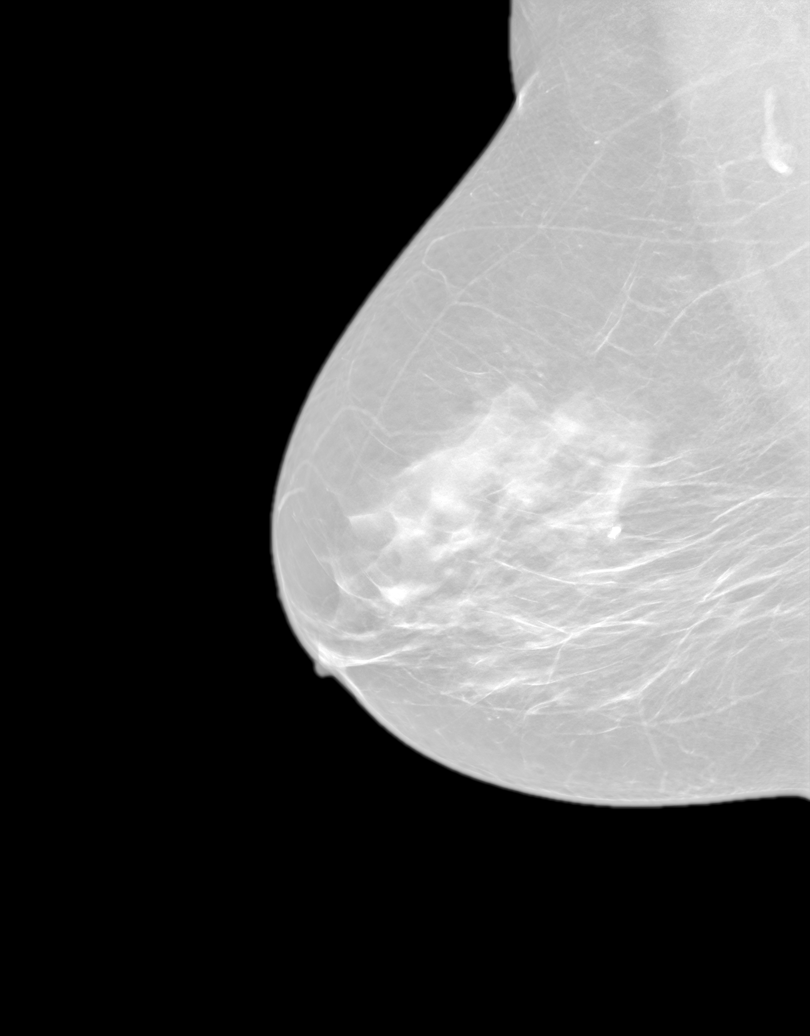

[L]
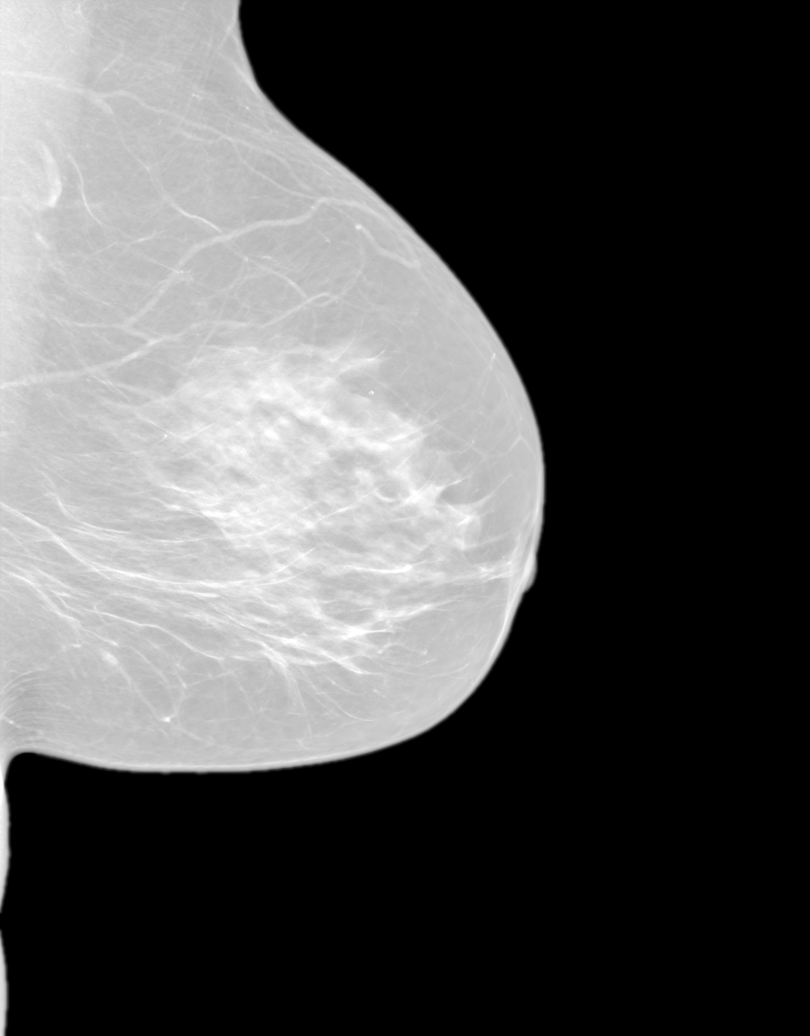

[7 of 24 positions shown; findings below may reference images not displayed]

FINDINGS: Breast parenchyma is heterogeneously dense.  There is no mass or suspicious cluster of microcalcifications.   There is no architectural distortion, skin thickening or nipple retraction.
IMPRESSION: 1.  BIRADS 2-Benign findings. Patient has been added in a reminder system with a target date for the next screening mammography.

2.  DENSITY CODE –  C (Heterogeneously dense). 

Final Assessment Code:

Bi-Rads 2 

BI-RADS 0
Need additional imaging evaluation

BI-RADS 1
Negative mammogram

BI-RADS 2
Benign finding

BI-RADS 3
Probably benign finding; short-interval follow-up suggested

BI-RADS 4
Suspicious abnormality; biopsy should be considered

BI-RADS 5
Highly suggestive of malignancy; appropriate action should be taken

BI-RADS 6
Known biopsy-proven malignancy; appropriate action should be taken

NOTE:
In compliance with Federal regulations, the results of this mammogram are being sent to the patient.

## 2019-06-29 IMAGING — MG 3D SCREENING MAMMO BIL W/CAD
5 series · 7 of 24 positions shown · non-contrast
Comparison: 10/07/2020 and 10/01/2019.

------------- REPORT [id number] -------------
[facility]
[address]
Daina Ms.ALLSTARR, ADEPRASSTYO:
We wish to report the following on your recent mammography examination. We are sending a report to your referring physician or other health care provider. 
(       Normal/Negative:
No evidence of cancer.
This statement is mandated by the [place], Department of Health.
Your examination was performed by one of our technologists, who are registered radiological technologists and also specially certified in mammography:
___
Parlak, Edaly (M)
Nepomuceno, Martinez (M)

Your mammogram was interpreted by our radiologist.
( 
Sofeine Made, M.D.
(Annual Breast Examination by a physician or other health care provider
(Annual Mammography Screening beginning at age 40
(Monthly Breast Self Examination
------------- REPORT [id number] -------------
NESIGILINK, AIVARUNCE
EXAM:  3D BILATERAL ANNUAL SCREENING DIGITAL MAMMOGRAM WITH CAD AND TOMOSYNTHESIS
INDICATION: Screening.

[Series 4901: R CC · right · 2 of 2 slices shown]
[im 1/2]
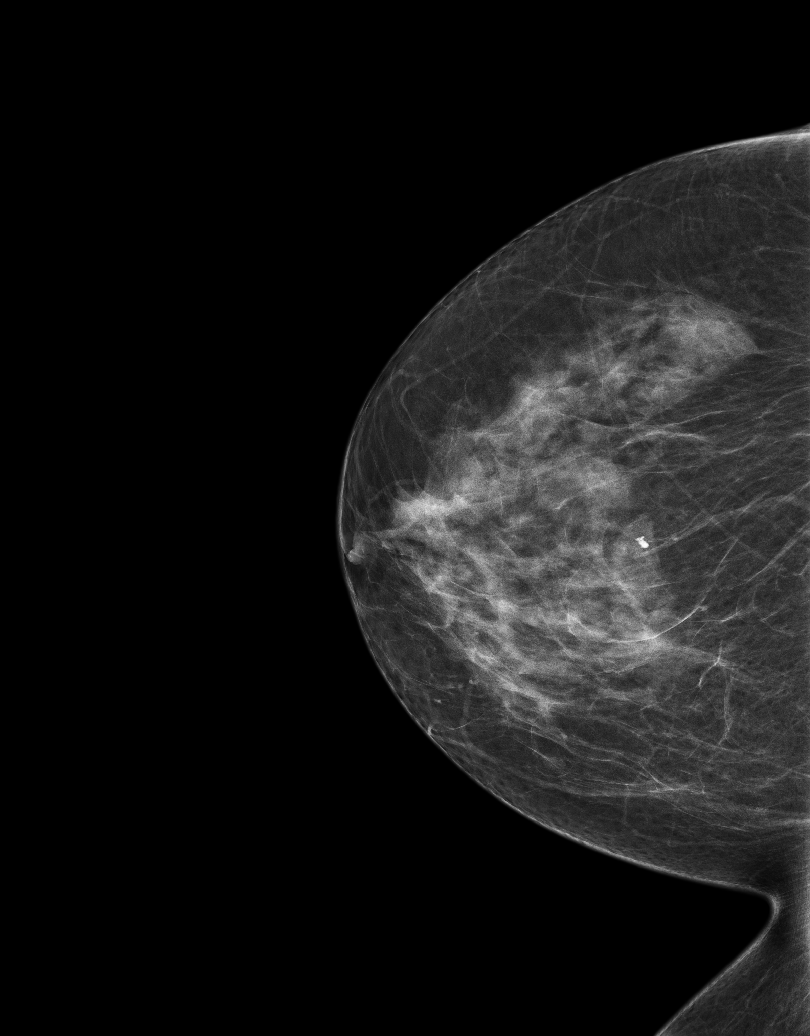
[im 2/2]
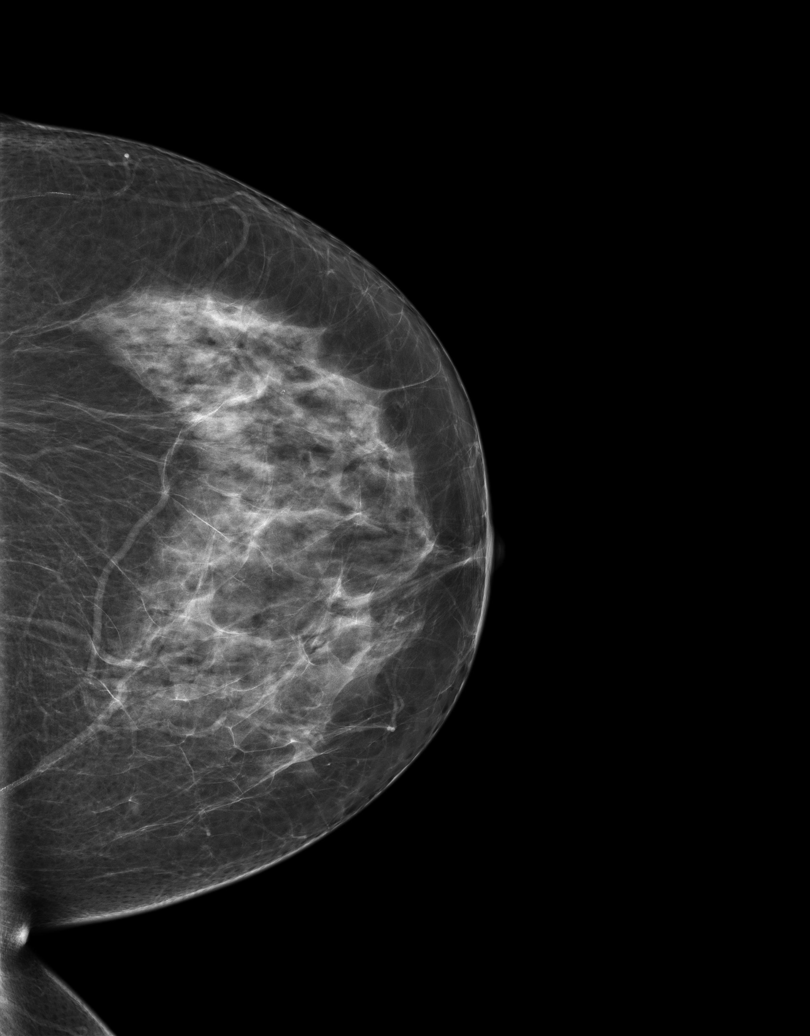

[Series 4903: 3D SCREENING MAMMO BIL W/CAD · 2 acquisitions, 2 frames shown (1 of 2)]
[im 1/2]
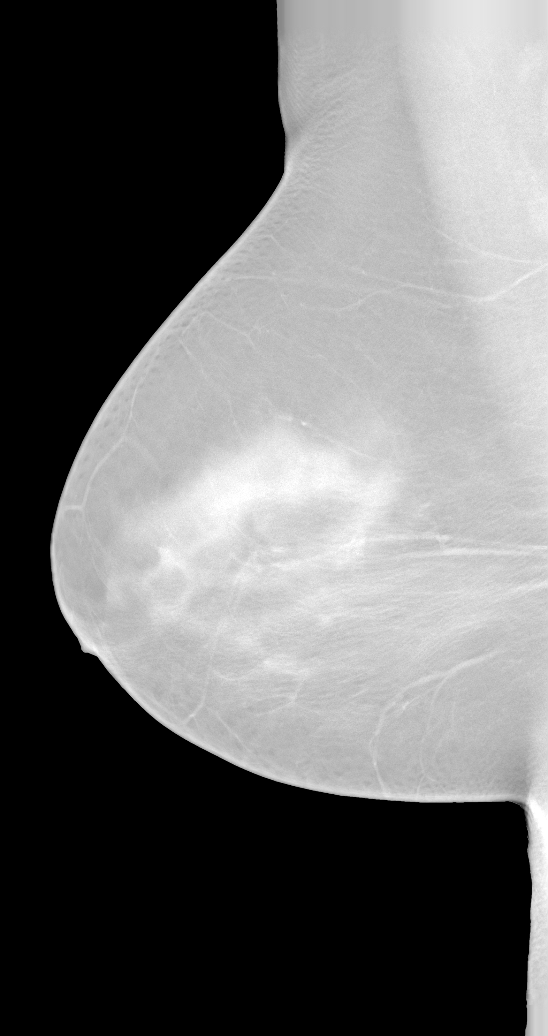
[im 2/2]
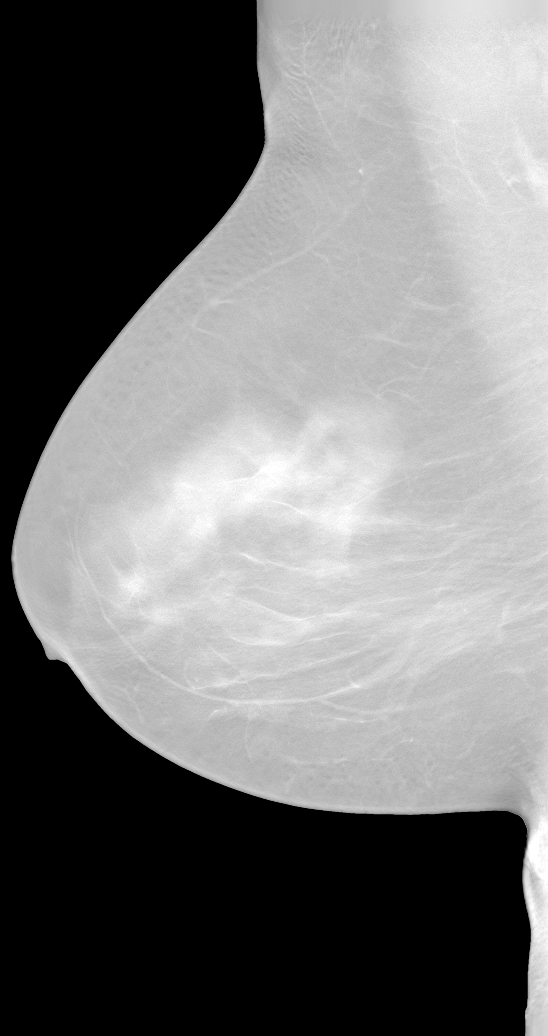

[R]
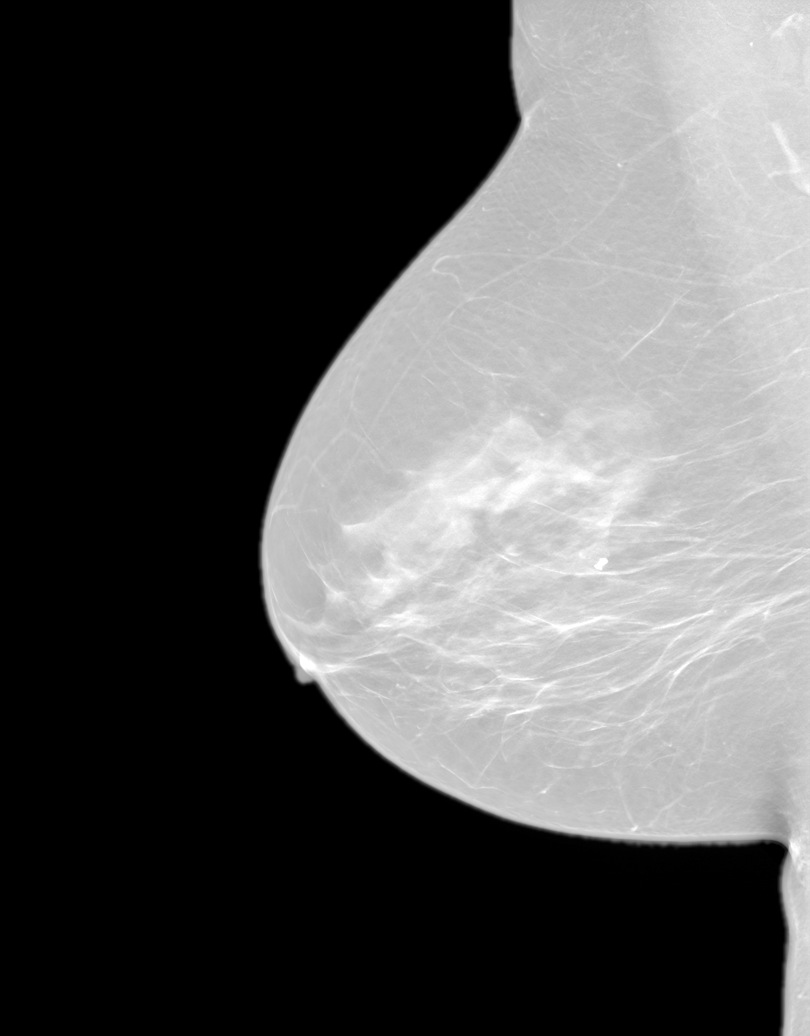

[3D SCREENING MAMMO BIL W/CAD (2 of 2) · tomo slice 11/66.0]
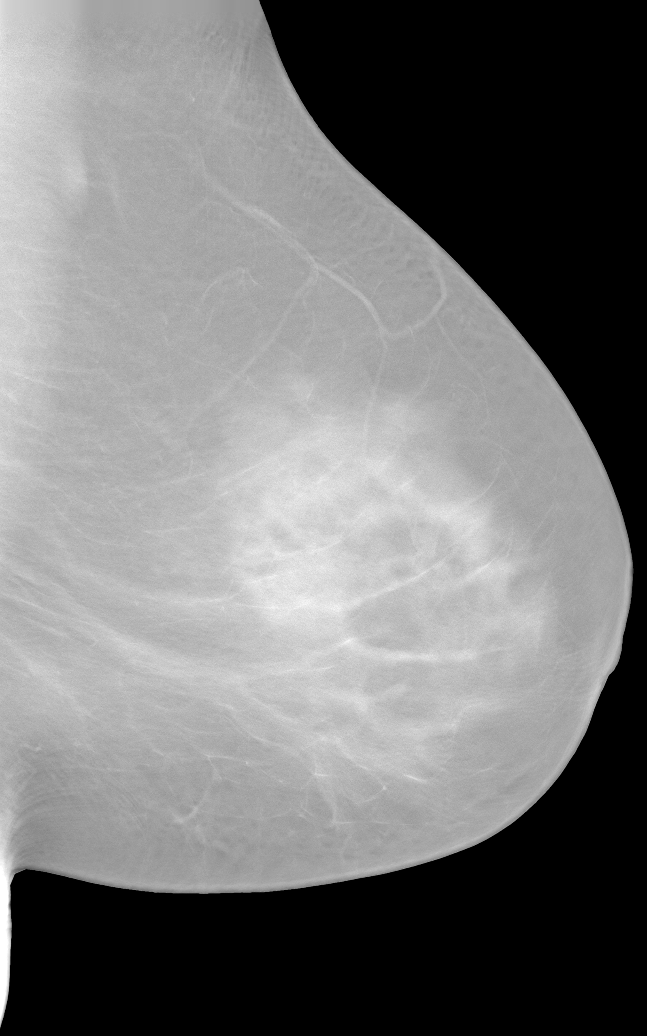

[L]
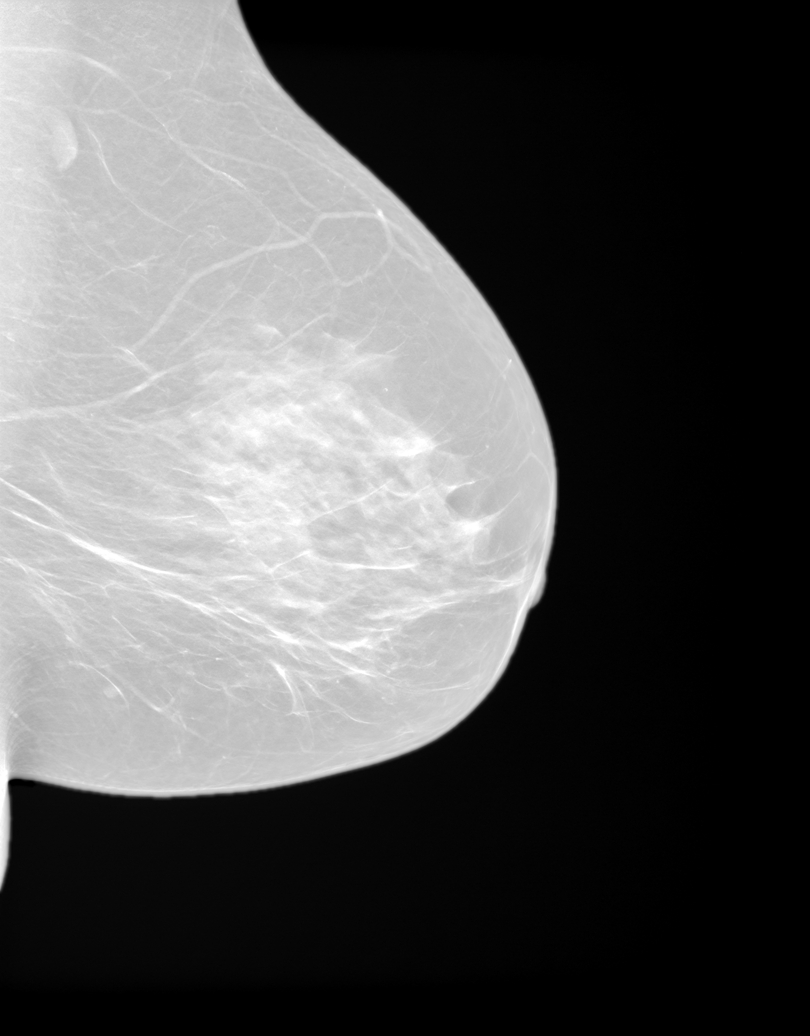

[7 of 24 positions shown; findings below may reference images not displayed]

FINDINGS: Breast parenchyma is heterogeneously dense.  There is no mass or suspicious cluster of microcalcifications.  There is no architectural distortion, skin thickening or nipple retraction.
IMPRESSION: 1.  BIRADS 2-Benign findings. Patient has been added in a reminder system with a target date for the next screening mammography.

2.  DENSITY CODE – C (Heterogeneously dense). 

Final Assessment Code:

Bi-Rads 2 

BI-RADS 0
Need additional imaging evaluation.

BI-RADS 1
Negative mammogram.

BI-RADS 2
Benign finding.

BI-RADS 3
Probably benign finding; short-interval follow-up suggested.

BI-RADS 4
Suspicious abnormality; biopsy should be considered.

BI-RADS 5
Highly suggestive of malignancy; appropriate action should be taken.

BI-RADS 6
Known biopsy-proven malignancy; appropriate action should be taken. 

NOTE:
In compliance with Federal regulations, the results of this mammogram are being sent to the patient.

## 2020-08-26 IMAGING — MG 3D SCREENING MAMMO BIL W/CAD & TOMO
5 series · 7 of 24 positions shown · non-contrast
Comparison: 07/29/2020 and 07/29/2019.

------------- REPORT [id number] -------------
[facility]
[address]
Tiger Ms.JAMA, LOVELY:
We wish to report the following on your recent mammography examination. We are sending a report to your referring physician or other health care provider. 
(       Normal/Negative:
No evidence of cancer.
This statement is mandated by the [place], Department of Health.
Your examination was performed by one of our technologists, who are registered radiological technologists and also specially certified in mammography:
___
Markland, Marjuan (M)

Your mammogram was interpreted by our radiologist.
( 
Collette Sedman, M.D.
(Annual Breast Examination by a physician or other health care provider
(Annual Mammography Screening beginning at age 40
(Monthly Breast Self Examination
------------- REPORT [id number] -------------
﻿
MANALO, KELBY
EXAM:  3D BILATERAL ANNUAL SCREENING DIGITAL MAMMOGRAM WITH CAD AND TOMOSYNTHESIS
INDICATION: Screening.

[L]
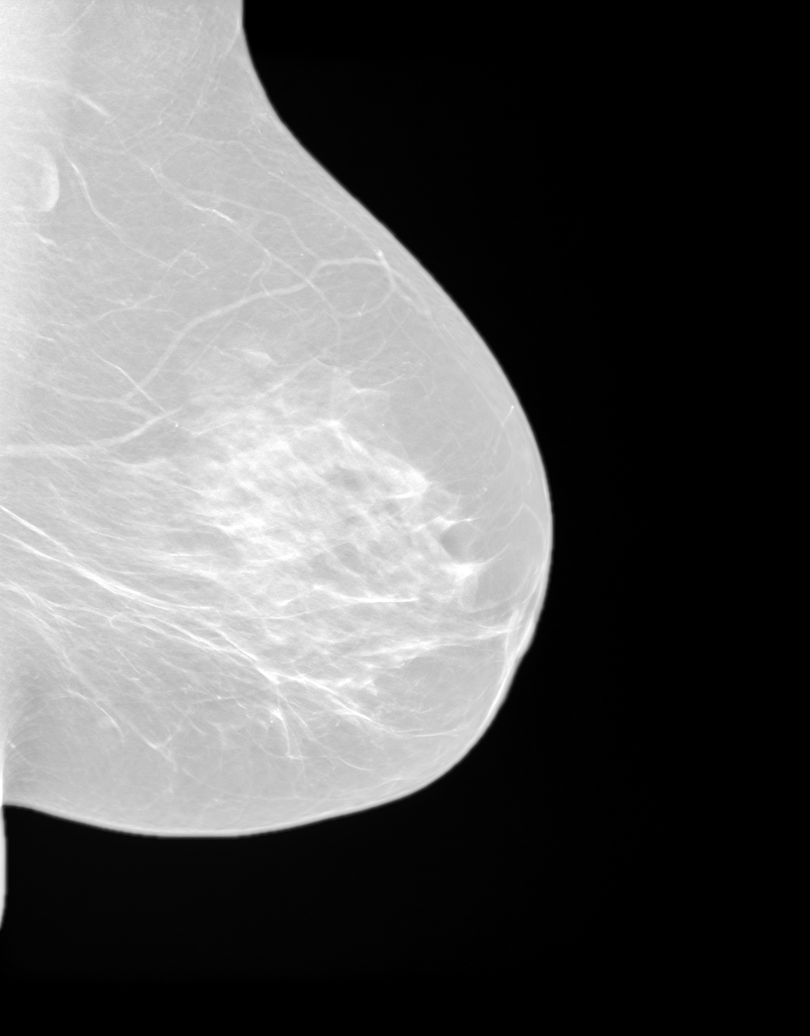

[R]
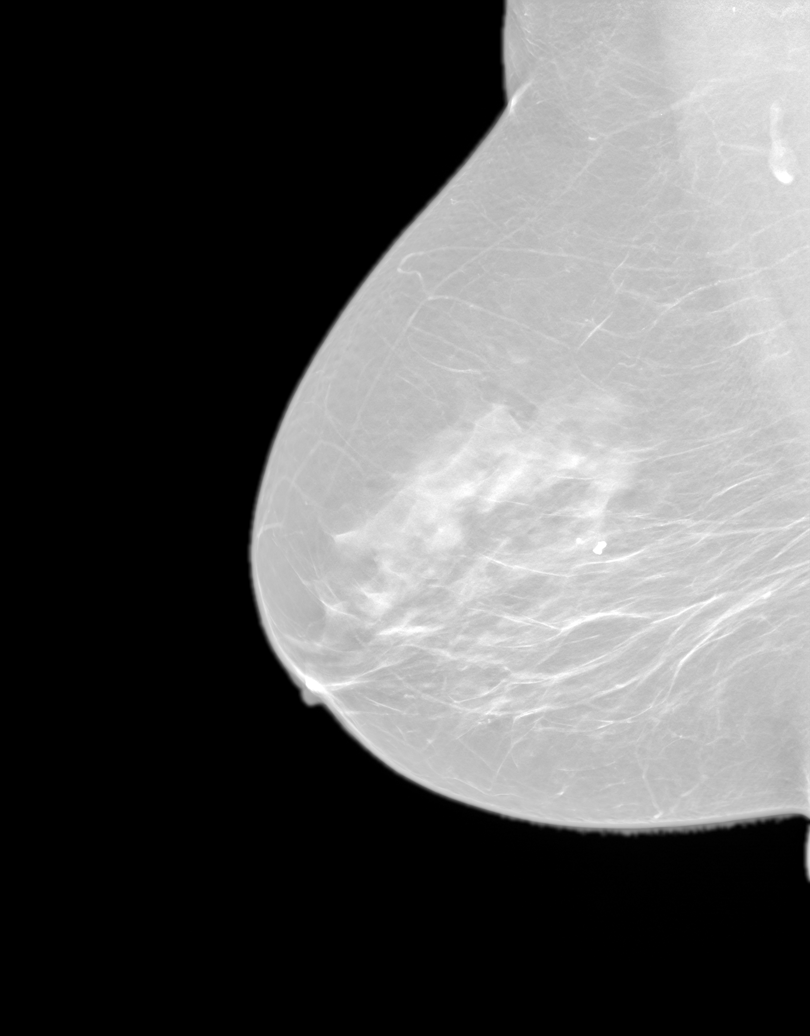

[R CC tomo · right · 0.10mm/px · 2 of 2 slices shown]
[im 1/2]
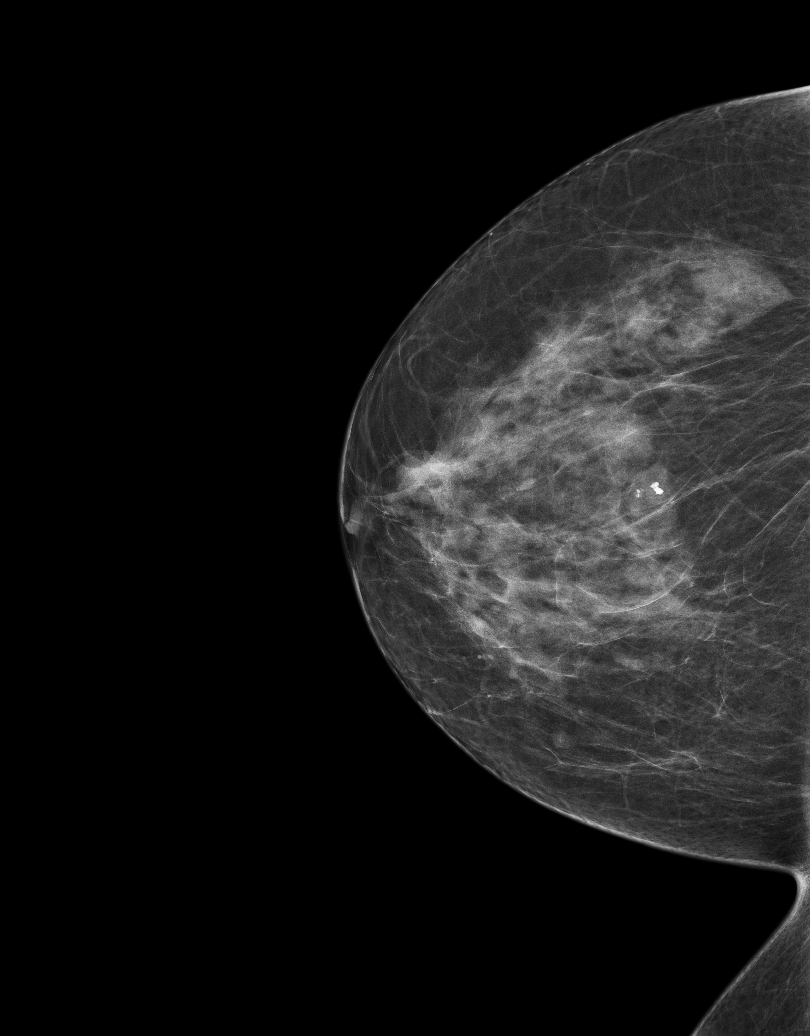
[im 2/2]
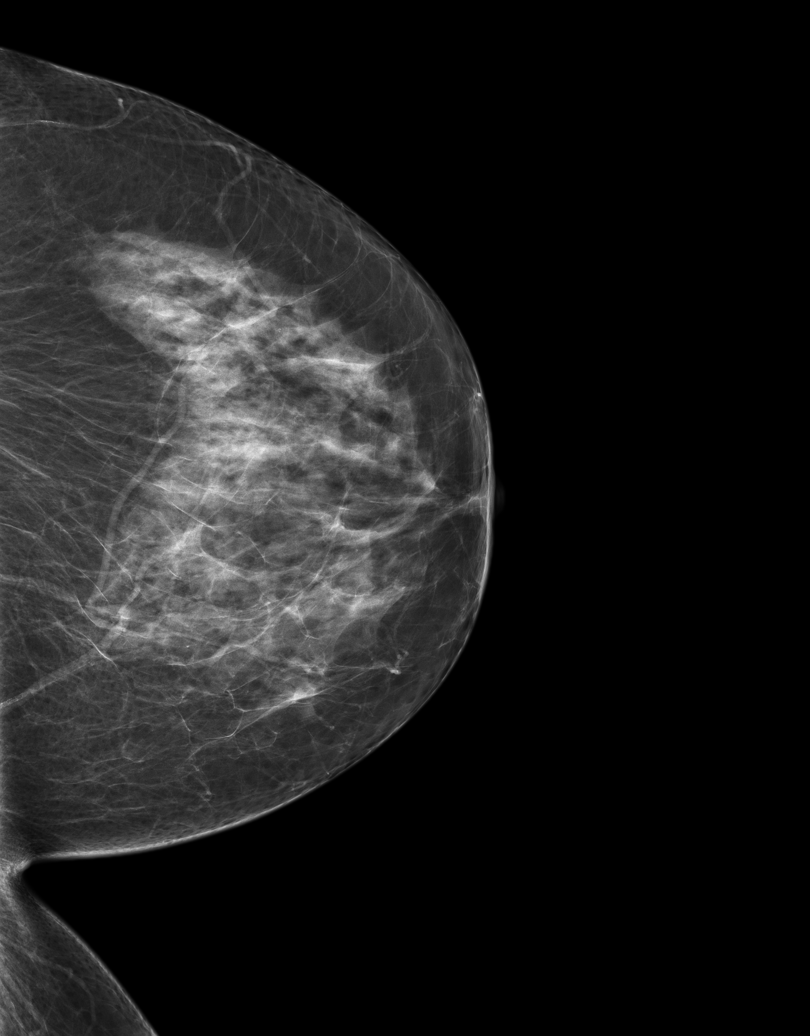

[3D SCREENING MAMMO BIL W/CAD & TOMO tomo · 2 acquisitions, 2 frames shown (1 of 2)]
[im 1/2]
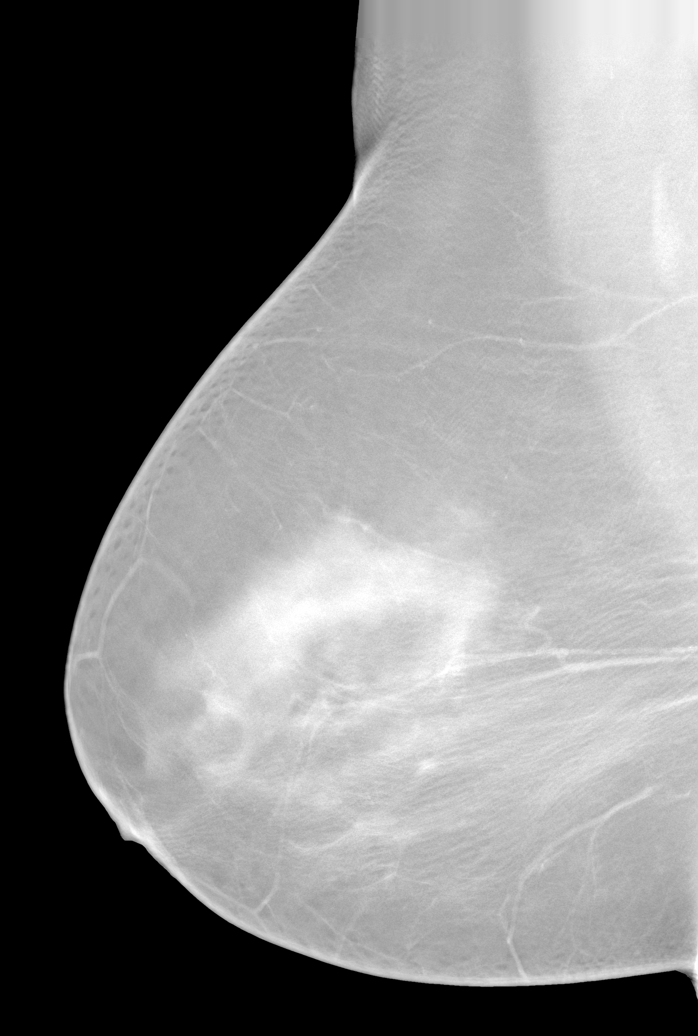
[im 2/2]
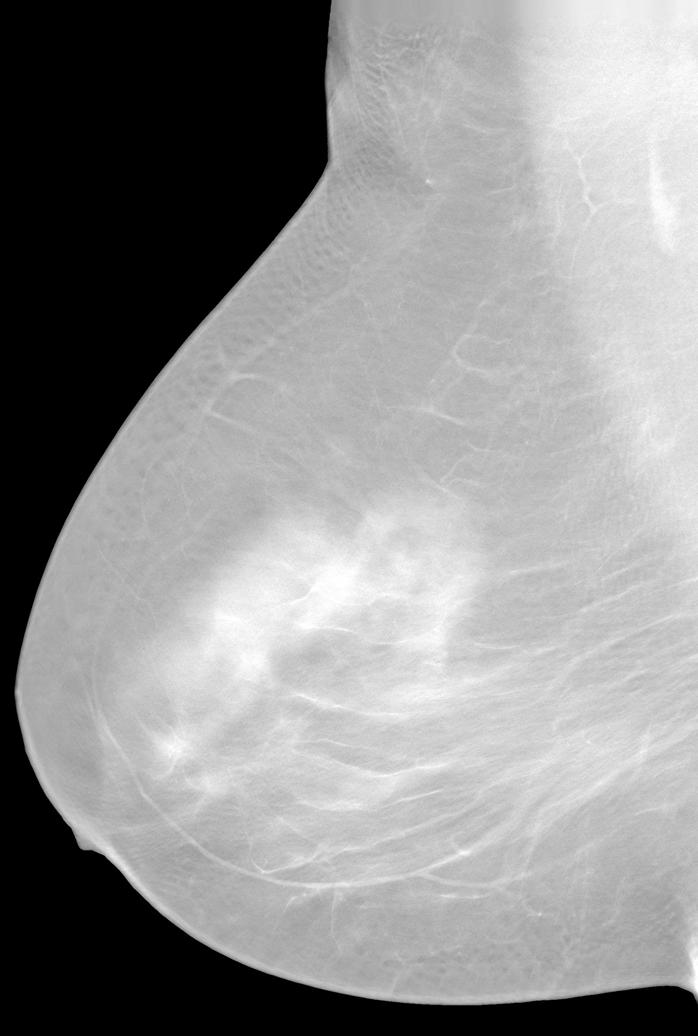

[3D SCREENING MAMMO BIL W/CAD & TOMO tomo (2 of 2) · tomo slice 11/67.0]
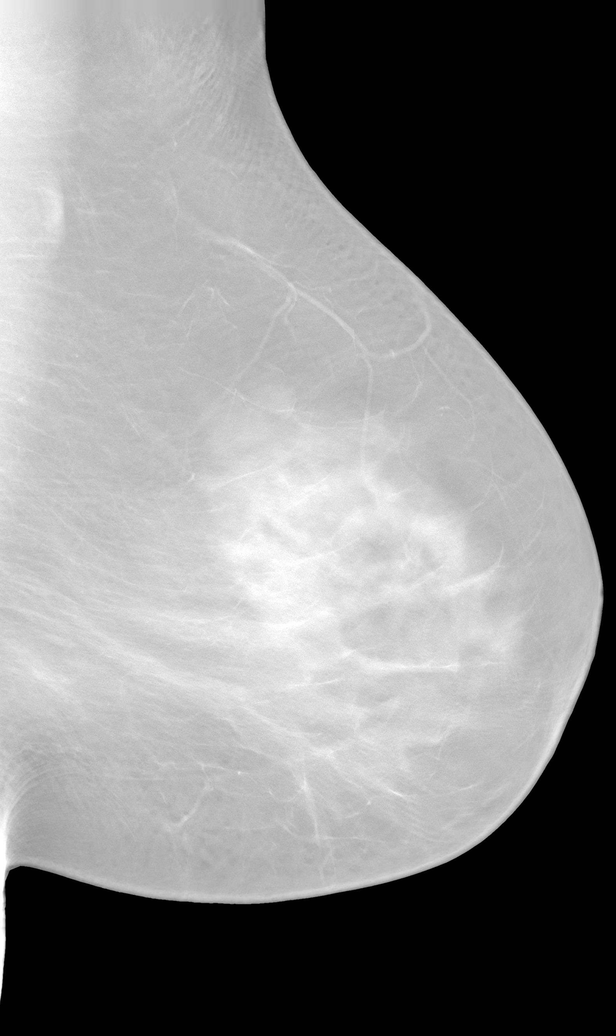

[7 of 24 positions shown; findings below may reference images not displayed]

FINDINGS: Breast parenchyma is heterogeneously dense.  There is no mass or suspicious cluster of microcalcifications.  There is no architectural distortion, skin thickening or nipple retraction.
IMPRESSION: 1.  BIRADS 2-Benign findings. Patient has been added in a reminder system with a target date for the next screening mammography.

2.  DENSITY CODE – C (Heterogeneously dense). 

Final Assessment Code:

Bi-Rads 2 

BI-RADS 0
 Need additional imaging evaluation.

BI-RADS 1
 Negative mammogram.

BI-RADS 2
 Benign finding.

BI-RADS 3
 Probably benign finding; short-interval follow-up suggested.

BI-RADS 4
 Suspicious abnormality; biopsy should be considered.

BI-RADS 5
 Highly suggestive of malignancy; appropriate action should be taken.

BI-RADS 6
 Known biopsy-proven malignancy; appropriate action should be taken.

NOTE:
In compliance with Federal regulations, the results of this mammogram are being sent to the patient.

## 2022-06-15 ENCOUNTER — Emergency Department
Admission: EM | Admit: 2022-06-15 | Discharge: 2022-06-15 | Disposition: A | Discharge status: Home / Self Care | Payer: Medicare Other | Attending: Family | Admitting: Family

## 2022-06-15 ENCOUNTER — Encounter (HOSPITAL_COMMUNITY): Payer: Self-pay | Admitting: Family

## 2022-06-15 ENCOUNTER — Emergency Department (HOSPITAL_COMMUNITY): Payer: Medicare Other

## 2022-06-15 ENCOUNTER — Other Ambulatory Visit: Payer: Self-pay

## 2022-06-15 DIAGNOSIS — S42295A Other nondisplaced fracture of upper end of left humerus, initial encounter for closed fracture: Secondary | ICD-10-CM

## 2022-06-15 DIAGNOSIS — S42202A Unspecified fracture of upper end of left humerus, initial encounter for closed fracture: Secondary | ICD-10-CM | POA: Insufficient documentation

## 2022-06-15 DIAGNOSIS — M25412 Effusion, left shoulder: Secondary | ICD-10-CM | POA: Insufficient documentation

## 2022-06-15 DIAGNOSIS — W010XXA Fall on same level from slipping, tripping and stumbling without subsequent striking against object, initial encounter: Secondary | ICD-10-CM | POA: Insufficient documentation

## 2022-06-15 DIAGNOSIS — S42293A Other displaced fracture of upper end of unspecified humerus, initial encounter for closed fracture: Secondary | ICD-10-CM

## 2022-06-15 HISTORY — DX: Unspecified asthma, uncomplicated: J45.909

## 2022-06-15 HISTORY — DX: Qualitative platelet defects (CMS HCC): D69.1

## 2022-06-15 HISTORY — DX: Essential (primary) hypertension: I10

## 2022-06-15 HISTORY — DX: Generalized anxiety disorder: F41.1

## 2022-06-15 HISTORY — DX: Gastro-esophageal reflux disease without esophagitis: K21.9

## 2022-06-15 HISTORY — DX: Mixed hyperlipidemia: E78.2

## 2022-06-15 HISTORY — DX: Disorder of thyroid, unspecified: E07.9

## 2022-06-15 MED ORDER — HYDROCODONE 5 MG-ACETAMINOPHEN 325 MG TABLET
1.0000 | ORAL_TABLET | Freq: Four times a day (QID) | ORAL | 0 refills | Status: AC | PRN
Start: 2022-06-15 — End: ?

## 2022-06-15 MED ORDER — HYDROMORPHONE 2 MG/ML INJECTION WRAPPER
1.0000 mg | INJECTION | INTRAMUSCULAR | Status: DC
Start: 2022-06-15 — End: 2022-06-15
  Administered 2022-06-15: 0 mg via SUBCUTANEOUS

## 2022-06-15 MED ORDER — MORPHINE 4 MG/ML INJECTION WRAPPER
4.0000 mg | INJECTION | INTRAMUSCULAR | Status: AC
Start: 2022-06-15 — End: 2022-06-15
  Administered 2022-06-15: 4 mg via INTRAMUSCULAR

## 2022-06-15 MED ORDER — MORPHINE 4 MG/ML INJECTION WRAPPER
INJECTION | INTRAMUSCULAR | Status: AC
Start: 2022-06-15 — End: 2022-06-15
  Filled 2022-06-15: qty 1

## 2022-06-15 MED ORDER — HYDROMORPHONE 2 MG/ML INJECTION WRAPPER
INJECTION | INTRAMUSCULAR | Status: AC
Start: 2022-06-15 — End: 2022-06-15
  Filled 2022-06-15: qty 1

## 2022-06-15 MED ORDER — HYDROMORPHONE 2 MG/ML INJECTION WRAPPER
1.0000 mg | INJECTION | INTRAMUSCULAR | Status: AC
Start: 2022-06-15 — End: 2022-06-15
  Administered 2022-06-15: 1 mg via INTRAMUSCULAR

## 2022-06-15 NOTE — ED Provider Notes (Signed)
[facility]  ED Primary Provider Note  History of Present Illness   Chief Complaint   Patient presents with    Shoulder Injury    Shoulder Pain     Lindsey Dunn is a 77 y.o. female who had concerns including Shoulder Injury and Shoulder Pain.  Arrival: The patient arrived by Car    Patient is a 77 year old female to the emergency department complaining of left shoulder pain.  Patient states she tripped and fell landing directly on her left shoulder.  Patient was complaining of shoulder and humerus pain.  Patient states pain is currently a 10/10 and patient was unable to move her arm without pain.  Patient denies taking medication prior to arrival.  Patient denies head strike, cervical spine pain.      History Reviewed This Encounter: Medical History  Surgical History  Family History  Social History    Physical Exam   ED Triage Vitals [06/15/22 1342]   BP (Non-Invasive) 131/68   Heart Rate 76   Respiratory Rate 18   Temperature 36.6 C (97.9 F)   SpO2 95 %   Weight 76.2 kg (168 lb)   Height 1.626 m (5\' 4" )     Physical Exam  Vitals and nursing note reviewed.   Constitutional:       General: She is not in acute distress.     Appearance: She is well-developed.   HENT:      Head: Normocephalic and atraumatic.   Eyes:      Conjunctiva/sclera: Conjunctivae normal.   Cardiovascular:      Rate and Rhythm: Normal rate and regular rhythm.      Heart sounds: No murmur heard.  Pulmonary:      Effort: Pulmonary effort is normal. No respiratory distress.      Breath sounds: Normal breath sounds.   Abdominal:      Palpations: Abdomen is soft.      Tenderness: There is no abdominal tenderness.   Musculoskeletal:         General: No swelling.      Cervical back: Neck supple.   Skin:     General: Skin is warm and dry.      Capillary Refill: Capillary refill takes less than 2 seconds.   Neurological:      Mental Status: She is alert.   Psychiatric:         Mood and Affect: Mood normal.        Patient Data   Labs Ordered/Reviewed - No data to display  XR FOREARM LEFT   Final Result by Edi, Radresults In (05/13 1421)   NO ACUTE FRACTURE OR DISLOCATION.             Radiologist location ID: [id number]         XR HUMERUS LEFT   Final Result by Edi, Radresults In (05/13 1421)   ACUTE, MINIMALLY DISPLACED COMMINUTED PROXIMAL LEFT HUMERUS FRACTURE             Radiologist location ID: [id number]           Medical Decision Making        Medical Decision Making  Patient is a 77 year old female to the emergency department complaining of left shoulder pain.  On physical examination swelling noted to left shoulder.  Patient has limited range of motion due to pain.  No obvious deformity otherwise.  X-rays ordered for evaluation and minimally nondisplaced proximal humerus head fracture noted.  Radial pulse  palpable.  Patient medicated she states pain is 10/10.  Patient given 4 mg of morphine IM and re-evaluated.  Mild amount of relief noted.  Patient additionally given 1 mg of Dilaudid.  Moderate amount of relief obtained after Dilaudid.  Patient was discharged home with Percocet to follow up with orthopedist this week.  If worsening of pain patient return to ED.    Risk  Prescription drug management.  Parenteral controlled substances.                Medications Administered in the ED   HYDROmorphone (DILAUDID) 2 mg/mL injection (0 mg Subcutaneous Not Given 06/15/22 1634)   morphine 4 mg/mL injection (4 mg IntraMUSCULAR Given 06/15/22 1526)   HYDROmorphone (DILAUDID) 2 mg/mL injection (1 mg IntraMUSCULAR Given 06/15/22 1700)     Clinical Impression   Humeral head fracture (Primary)       Disposition: Data Unavailable

## 2022-06-15 NOTE — ED Triage Notes (Addendum)
States loss balance and fell landing on the left arm/shoulder. States difficulty moving the shoulder. Denies LOC.

## 2022-06-15 NOTE — ED Nurses Note (Signed)
Per provider's orders, shoulder sling applied to patient's left arm.

## 2022-06-15 NOTE — ED Nurses Note (Signed)
Patient discharged home with family.  AVS reviewed with patient/care giver.  A written copy of the AVS and discharge instructions was given to the patient/care giver.  Questions sufficiently answered as needed.  Patient/care giver encouraged to follow up with PCP as indicated.  In the event of an emergency, patient/care giver instructed to call 911 or go to the nearest emergency room.

## 2022-06-15 NOTE — ED APP Handoff Note (Signed)
[facility]  Emergency Department  Provider in Triage Note    Name: KIMANI PING  Age: 77 y.o.  Gender: female     Subjective:   ROXANE HOMMEL is a 77 y.o. female who presents with complaint of Shoulder Injury and Shoulder Pain  .  Lost balance and fell onto left side. Denies head injury or LOC.     Objective:   Filed Vitals:    06/15/22 1342   BP: 131/68   Pulse: 76   Resp: 18   Temp: 36.6 C (97.9 F)   SpO2: 95%      Focused Physical Exam shows elderly female alert upright holding left arm. Tender and swelling left upper humerus. Tender left forearm midshaft. Non-tender left hand.     Assessment:  A medical screening exam was completed.  This patient is a 77 y.o. female with initial findings showing left arm pain after fall.    Plan:  Please see initial orders and work-up below.  This is to be continued with full evaluation in the main Emergency Department.     No current facility-administered medications for this encounter.     No results found for this or any previous visit (from the past 24 hour(s)).     Senaida Lange, PA-C  06/15/2022, 13:43

## 2022-11-17 IMAGING — MR MRI SHOULDER LT W/O CONTRAST
6 series · 40 of 40 positions shown · IV contrast (gadolinium)
Comparison: None available.

﻿EXAM:  [id number]   MRI SHOULDER LT W/O CONTRAST
INDICATION: Left shoulder pain.
TECHNIQUE: Multiplanar multisequential MRI of the left shoulder was performed without gadolinium contrast.

[Series 6: PD fat-sat · axial · left · 5.0mm · 0.50mm/px · z∈[-157,-65]mm · 7 of 20 slices shown (1 of 2)]
[im 1/20]
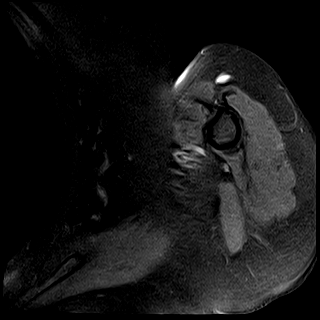
[im 4/20]
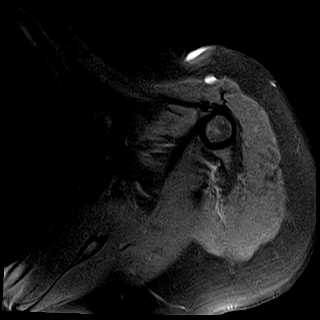
[im 7/20]
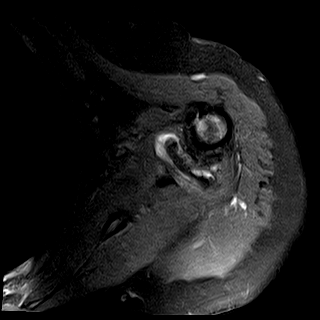
[im 10/20]
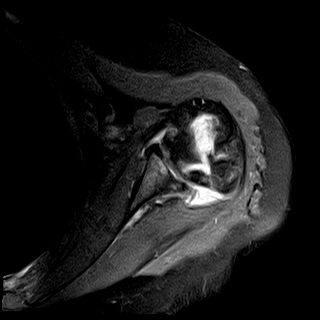
[im 13/20]
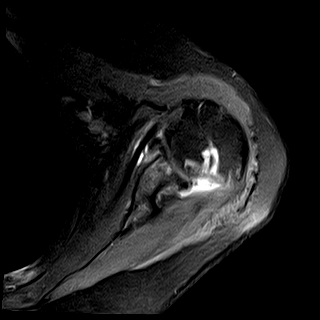
[im 16/20]
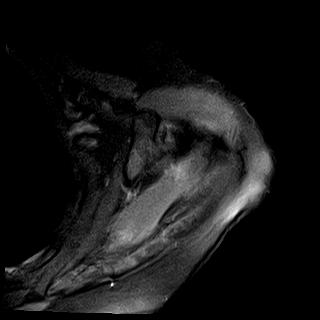
[im 20/20]
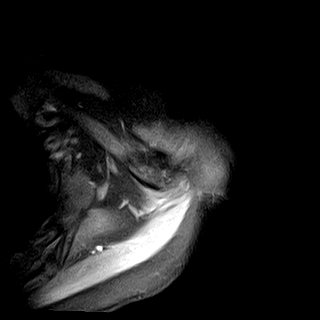

[Series 8: STIR · oblique · left · 4.0mm · 0.47mm/px · 6 of 20 slices shown (1 of 2)]
[im 1/20]
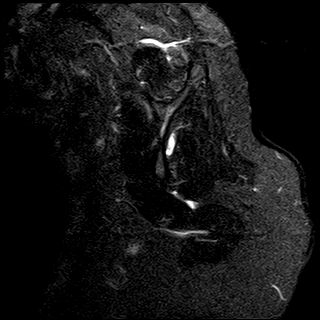
[im 4/20]
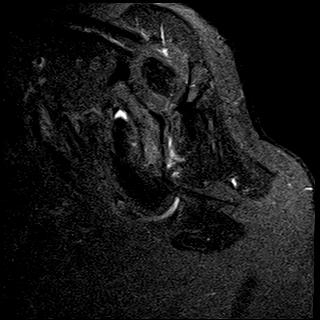
[im 8/20]
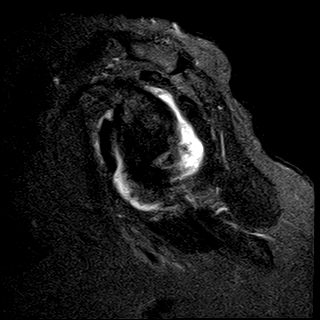
[im 12/20]
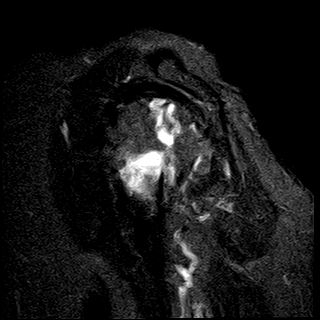
[im 16/20]
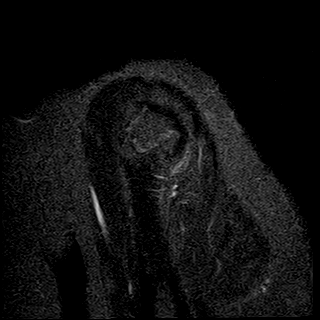
[im 20/20]
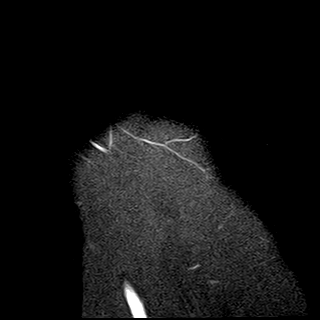

[Series 9: T2 fat-sat · axial · left · 4.5mm · 0.42mm/px · z∈[-154,-52]mm · 7 of 24 slices shown]
[im 1/24]
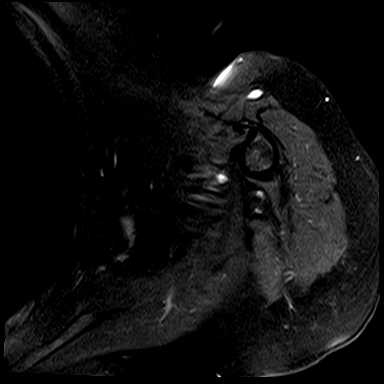
[im 4/24]
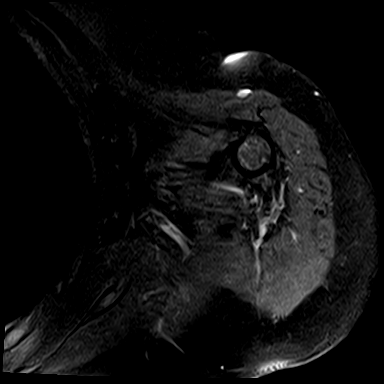
[im 8/24]
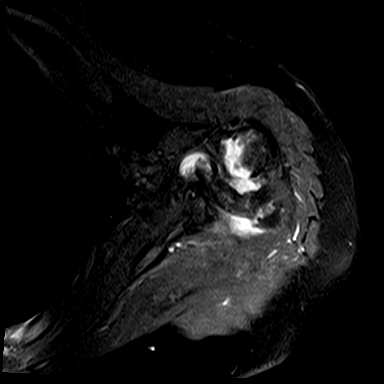
[im 12/24]
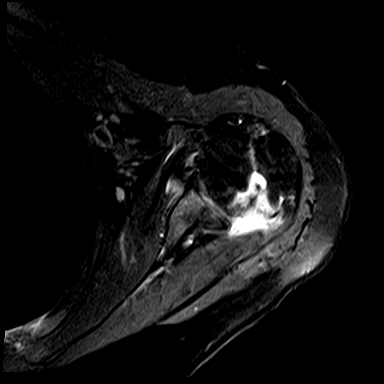
[im 16/24]
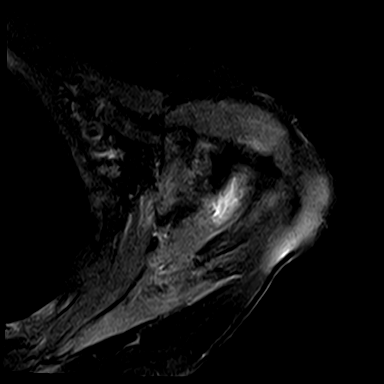
[im 20/24]
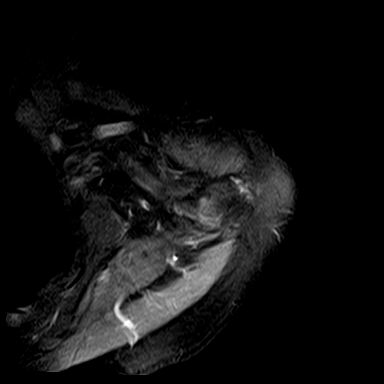
[im 24/24]
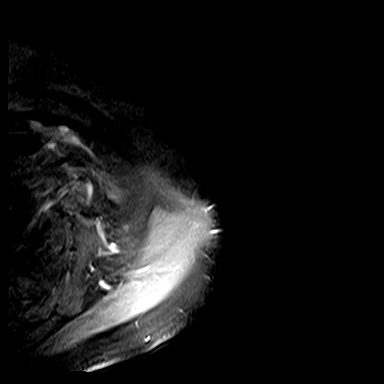

[Series 10: T1 · oblique · left · 4.5mm · 0.33mm/px · 6 of 21 slices shown]
[im 1/21]
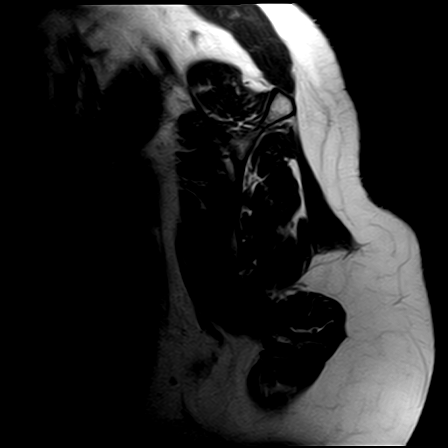
[im 5/21]
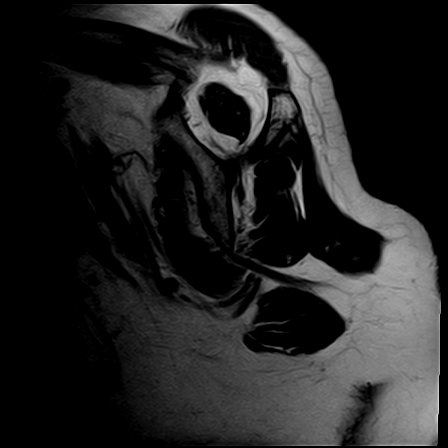
[im 9/21]
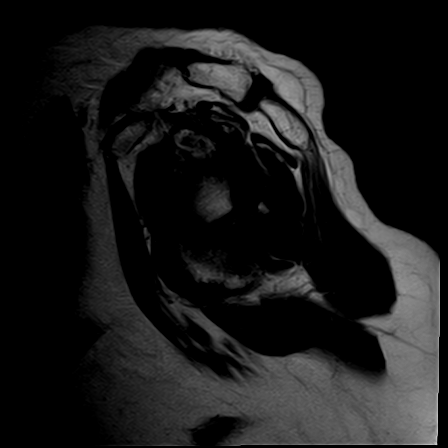
[im 13/21]
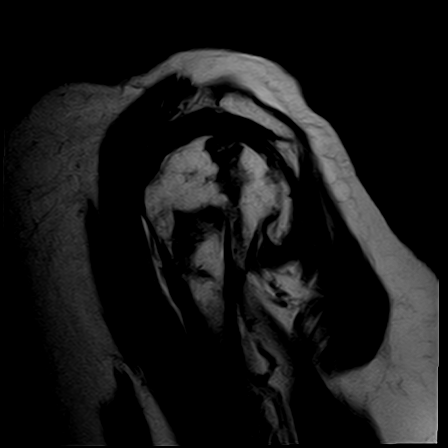
[im 17/21]
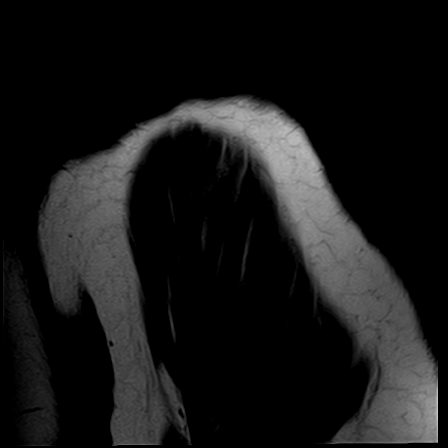
[im 21/21]
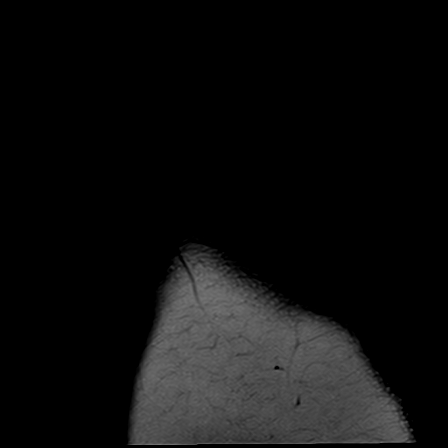

[Series 11: PD fat-sat · coronal · left · 4.0mm · 0.47mm/px · 7 of 23 slices shown (2 of 2)]
[im 1/23]
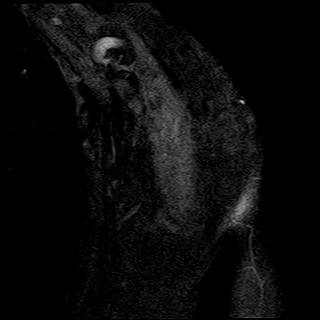
[im 4/23]
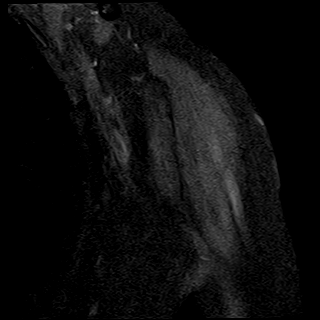
[im 8/23]
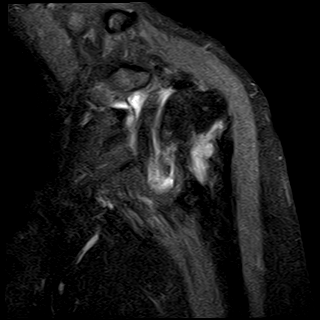
[im 12/23]
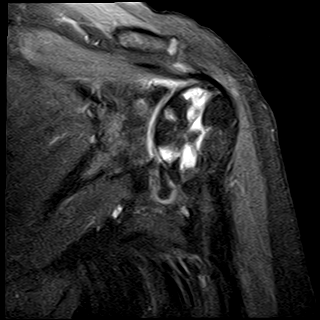
[im 15/23]
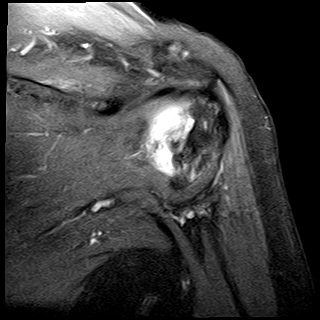
[im 19/23]
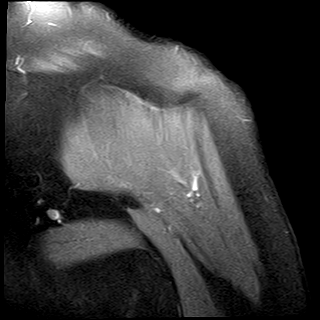
[im 23/23]
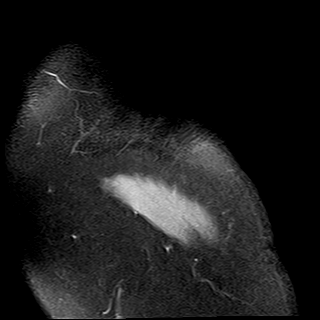

[Series 12: STIR · coronal · left · 4.0mm · 0.47mm/px · 7 of 23 slices shown (2 of 2)]
[im 1/23]
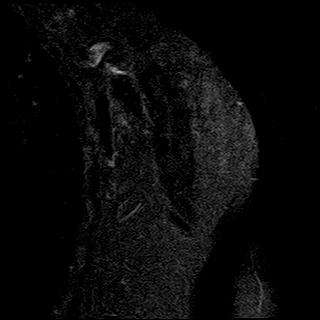
[im 4/23]
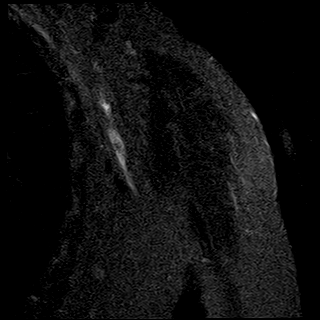
[im 8/23]
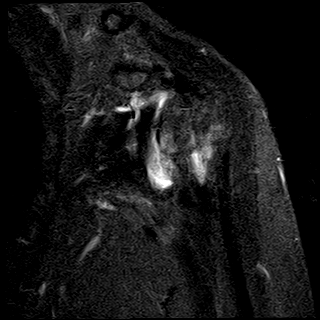
[im 12/23]
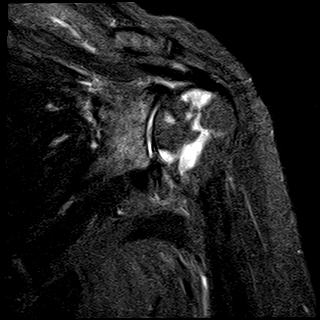
[im 15/23]
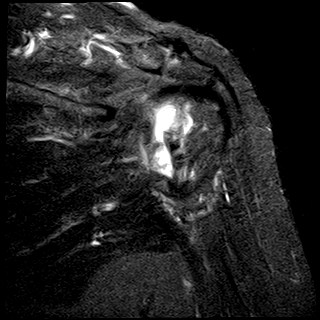
[im 19/23]
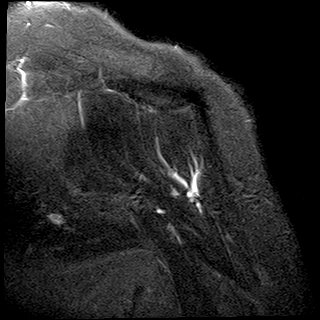
[im 23/23]
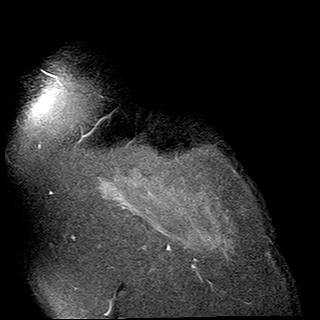

[40 of 40 positions shown; findings below may reference images not displayed]

FINDINGS: There is a severely comminuted fracture of the proximal humerus.  Plain film examination or CT are superior to the current MRI examination for the evaluation of bony fragments, but no such exams are available at this time for correlation.  

There is a small, partial tear involving the distal supraspinatus tendon.  The rotator cuff appears otherwise intact.  The glenoid labrum is intact.  There are moderate degenerative changes.  There is a small joint effusion.  No fluid is seen within the subacromial and subdeltoid bursa.  

There are mild degenerative changes involving the acromioclavicular joint with a down sloping acromion and mild impingement.
IMPRESSION: 1. Severely comminuted proximal humeral fracture. 

2. Partial tear of the supraspinatus tendon.
